# Patient Record
Sex: Male | Born: 1940 | Race: White | Hispanic: No | Marital: Married | Smoking: Never smoker
Health system: Southern US, Community
[De-identification: ages and names within clinical notes are randomized; demographics above are authoritative.]

## PROBLEM LIST (undated history)

## (undated) DIAGNOSIS — Z951 Presence of aortocoronary bypass graft: Secondary | ICD-10-CM

## (undated) DIAGNOSIS — I10 Essential (primary) hypertension: Secondary | ICD-10-CM

## (undated) DIAGNOSIS — R112 Nausea with vomiting, unspecified: Secondary | ICD-10-CM

## (undated) DIAGNOSIS — Z9189 Other specified personal risk factors, not elsewhere classified: Secondary | ICD-10-CM

## (undated) DIAGNOSIS — I350 Nonrheumatic aortic (valve) stenosis: Secondary | ICD-10-CM

## (undated) DIAGNOSIS — R29898 Other symptoms and signs involving the musculoskeletal system: Secondary | ICD-10-CM

## (undated) DIAGNOSIS — M1711 Unilateral primary osteoarthritis, right knee: Secondary | ICD-10-CM

## (undated) DIAGNOSIS — E039 Hypothyroidism, unspecified: Secondary | ICD-10-CM

## (undated) DIAGNOSIS — I77810 Thoracic aortic ectasia: Secondary | ICD-10-CM

## (undated) DIAGNOSIS — Z9889 Other specified postprocedural states: Secondary | ICD-10-CM

## (undated) DIAGNOSIS — I2510 Atherosclerotic heart disease of native coronary artery without angina pectoris: Secondary | ICD-10-CM

## (undated) DIAGNOSIS — S83249A Other tear of medial meniscus, current injury, unspecified knee, initial encounter: Secondary | ICD-10-CM

## (undated) DIAGNOSIS — I517 Cardiomegaly: Secondary | ICD-10-CM

## (undated) DIAGNOSIS — I340 Nonrheumatic mitral (valve) insufficiency: Secondary | ICD-10-CM

## (undated) DIAGNOSIS — G4733 Obstructive sleep apnea (adult) (pediatric): Secondary | ICD-10-CM

## (undated) DIAGNOSIS — Z9989 Dependence on other enabling machines and devices: Secondary | ICD-10-CM

## (undated) DIAGNOSIS — Z8679 Personal history of other diseases of the circulatory system: Secondary | ICD-10-CM

## (undated) DIAGNOSIS — Z973 Presence of spectacles and contact lenses: Secondary | ICD-10-CM

## (undated) DIAGNOSIS — E785 Hyperlipidemia, unspecified: Secondary | ICD-10-CM

## (undated) DIAGNOSIS — D509 Iron deficiency anemia, unspecified: Secondary | ICD-10-CM

## (undated) DIAGNOSIS — M1990 Unspecified osteoarthritis, unspecified site: Secondary | ICD-10-CM

## (undated) DIAGNOSIS — I440 Atrioventricular block, first degree: Secondary | ICD-10-CM

## (undated) DIAGNOSIS — N400 Enlarged prostate without lower urinary tract symptoms: Secondary | ICD-10-CM

## (undated) HISTORY — PX: CORONARY ARTERY BYPASS GRAFT: SHX141

---
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
## 2003-02-20 ENCOUNTER — Encounter: Payer: Self-pay | Admitting: Specialist

## 2003-02-20 ENCOUNTER — Ambulatory Visit (HOSPITAL_COMMUNITY): Admission: RE | Admit: 2003-02-20 | Discharge: 2003-02-20 | Payer: Self-pay | Admitting: Specialist

## 2003-03-06 ENCOUNTER — Encounter: Payer: Self-pay | Admitting: Specialist

## 2003-03-07 ENCOUNTER — Encounter: Payer: Self-pay | Admitting: Specialist

## 2003-03-07 ENCOUNTER — Observation Stay (HOSPITAL_COMMUNITY): Admission: RE | Admit: 2003-03-07 | Discharge: 2003-03-09 | Payer: Self-pay | Admitting: Specialist

## 2003-08-09 HISTORY — PX: SHOULDER OPEN ROTATOR CUFF REPAIR: SHX2407

## 2008-08-08 HISTORY — PX: LUMBAR SPINE SURGERY: SHX701

## 2013-08-08 DIAGNOSIS — M1711 Unilateral primary osteoarthritis, right knee: Secondary | ICD-10-CM

## 2013-08-08 DIAGNOSIS — S83249A Other tear of medial meniscus, current injury, unspecified knee, initial encounter: Secondary | ICD-10-CM

## 2013-08-08 HISTORY — DX: Unilateral primary osteoarthritis, right knee: M17.11

## 2013-08-08 HISTORY — DX: Other tear of medial meniscus, current injury, unspecified knee, initial encounter: S83.249A

## 2013-11-01 ENCOUNTER — Other Ambulatory Visit: Payer: Self-pay | Admitting: Orthopedic Surgery

## 2013-11-05 ENCOUNTER — Encounter (HOSPITAL_BASED_OUTPATIENT_CLINIC_OR_DEPARTMENT_OTHER): Payer: Self-pay | Admitting: *Deleted

## 2013-11-07 ENCOUNTER — Other Ambulatory Visit: Payer: Self-pay | Admitting: Orthopedic Surgery

## 2013-11-07 ENCOUNTER — Encounter (HOSPITAL_BASED_OUTPATIENT_CLINIC_OR_DEPARTMENT_OTHER): Payer: Self-pay | Admitting: *Deleted

## 2013-11-07 NOTE — Progress Notes (Signed)
SPOKE W/ PT WIFE.  NPO AFTER MN WITH EXCEPTION CLEAR LIQUIDS(NO CREAM/ MILK PRODUCTS) UNTIL 0800 INCLUDING NO CHEW TOBACCO. ARRIVE AT 1215.  NEEDS ISTAT.  CURRENT EKG, LOV NOTE, AND STRESS TEST TO BE FAXED FROM CARDIOLOGIST IN [place] AND PT'S PCP .  WILL TAKE ATENOLOL, LIPITOR, AND SYNTHROID AM DOS W/ SIPS OF WATER.

## 2013-11-07 NOTE — Progress Notes (Addendum)
11/07/13 1222  OBSTRUCTIVE SLEEP APNEA  Have you ever been diagnosed with sleep apnea through a sleep study? No  Do you snore loudly (loud enough to be heard through closed doors)?  1  Do you often feel tired, fatigued, or sleepy during the daytime? 0  Has anyone observed you stop breathing during your sleep? 1  Do you have, or are you being treated for high blood pressure? 1  BMI more than 35 kg/m2? 0  Age over 71 years old? 1  Neck circumference greater than 40 cm/18 inches? 1  Gender: 1  Obstructive Sleep Apnea Score 6  Score 4 or greater  Results sent to PCP   TO BE FAXED TO PT'S PCP ,  DR Lorie ApleyBRIAN MITCHELL [place]  [phone]

## 2013-11-07 NOTE — H&P (Signed)
Dominic Rogers is an 73 y.o. male.   Chief Complaint: R knee pain HPI: Intermittent R knee pain x years refractory to multiple aspirations and injections, recurrent effusions and pain. No instability or mechanical symptoms.  Past Medical History  Diagnosis Date  . Right knee DJD   . Hypertension   . Hyperlipidemia   . S/P CABG x 4     2012  . Iron deficiency anemia   . Hypothyroidism   . BPH (benign prostatic hypertrophy)   . Arthritis     knees, shoulders, hands  . Wears glasses   . At risk for sleep apnea     STOP-BANG=  6   SENT TO PCP DR Lorie Apley  IN [place], [place]  . PONV (postoperative nausea and vomiting)   . Coronary artery disease     CARDIOLOGIST--  DR Serena Croissant  WITH [facility]    Past Surgical History  Procedure Laterality Date  . Shoulder open rotator cuff repair Right 2005  . Lumbar spine surgery  2010  . Coronary artery bypass graft  2012  mission hospital in [place]    4 vessel  . Orif left hand and reattachement two fingers  1960's    No family history on file. Social History:  reports that he has never smoked. His smokeless tobacco use includes Chew. He reports that he drinks alcohol. He reports that he does not use illicit drugs.  Allergies:  Allergies  Allergen Reactions  . Morphine And Related Itching    SEVERE     (Not in a hospital admission)  No results found for this or any previous visit (from the past 48 hour(s)). No results found.  Review of Systems  Constitutional: Negative.   HENT: Negative.   Eyes: Negative.   Respiratory: Negative.   Cardiovascular: Negative.   Gastrointestinal: Negative.   Genitourinary: Negative.   Musculoskeletal: Positive for joint pain.  Skin: Negative.   Neurological: Negative.   Psychiatric/Behavioral: Negative.     There were no vitals taken for this visit. Physical Exam  Constitutional: He is oriented to person, place, and time. He appears well-developed and  well-nourished.  HENT:  Head: Normocephalic and atraumatic.  Eyes: Conjunctivae and EOM are normal. Pupils are equal, round, and reactive to light.  Neck: Normal range of motion. Neck supple.  Cardiovascular: Normal rate and regular rhythm.   Respiratory: Effort normal and breath sounds normal.  GI: Soft. Bowel sounds are normal.  Musculoskeletal:  Right Knee: Inspection and Palpation:Tenderness- medial joint line tender to palpation. no tenderness to palpation of the superior calf, no tenderness to palpation of the pes anserine bursa, no tenderness to palpation of the quadriceps tendon, no tenderness to palpation of the patellar tendon, no tenderness to palpation of the patella, no tenderness to palpation of the lateral joint line, no tenderness to palpation of the fibular head and no tenderness to palpation of the peroneal nerve. Swelling- periarticular swelling present. Effusion- mild. Tissue tension/texture is - soft. Crepitus- mild. Pulses- 2+. Sensation- intact to light touch. Skin- Color- no ecchymosis and no erythema. Strength and Tone:Quadriceps- 5/5. Hamstrings- 5/5. ROM: Flexion:AROM- 110 . Extension:AROM- 0 . Stability- Valgus Laxity at 30- None. Valgus Laxity at 0- None. Varus Laxity at 30- None. Varus Laxity at 0- None. Lachman- Negative. Anterior Drawer Test - Negative. Posterior Drawer Test- Negative. Deformities/Malalignments/Discrepancies- no deformities noted. Special Tests:McMurray Test (lateral) - negative. McMurray Test (medial)- negative. Patellar Compression Pain- mild pain.  Neurological: He is alert and  oriented to person, place, and time. He has normal reflexes.  Skin: Skin is warm and dry.  Psychiatric: He has a normal mood and affect.     Assessment/Plan R knee DJD  I had a long discussion with the patient concerning the risks and benefits of knee arthroscopy including help from the arthroscopic procedure as well as no help  from the arthroscopic procedure or worsening of symptoms. Also discussed infection, DVT, PE, anesthetic complications, etc. Also discussed the possibility of repeat arthroscopic surgery required in the future or total knee replacement. I provided the patient with an illustrated handout and discussed it in detail as well as discussed the postoperative and perioperative courses and return to functional activities including work. Need for postoperative DVT prophylaxis was discussed as well.   Plan R knee arthroscopy, debridement  BISSELL, JACLYN M. for Dr. Shelle IronBeane 11/07/2013, 1:22 PM

## 2013-11-15 ENCOUNTER — Ambulatory Visit (HOSPITAL_BASED_OUTPATIENT_CLINIC_OR_DEPARTMENT_OTHER)
Admission: RE | Admit: 2013-11-15 | Discharge: 2013-11-15 | Disposition: A | Discharge status: Home / Self Care | Payer: Medicare Other | Source: Ambulatory Visit | Attending: Specialist | Admitting: Specialist

## 2013-11-15 ENCOUNTER — Encounter (HOSPITAL_BASED_OUTPATIENT_CLINIC_OR_DEPARTMENT_OTHER): Payer: Medicare Other | Admitting: Anesthesiology

## 2013-11-15 ENCOUNTER — Ambulatory Visit (HOSPITAL_BASED_OUTPATIENT_CLINIC_OR_DEPARTMENT_OTHER): Payer: Medicare Other | Admitting: Anesthesiology

## 2013-11-15 ENCOUNTER — Encounter (HOSPITAL_BASED_OUTPATIENT_CLINIC_OR_DEPARTMENT_OTHER): Payer: Self-pay

## 2013-11-15 ENCOUNTER — Encounter (HOSPITAL_BASED_OUTPATIENT_CLINIC_OR_DEPARTMENT_OTHER)
Admission: RE | Disposition: A | Discharge status: Home / Self Care | Payer: Self-pay | Source: Ambulatory Visit | Attending: Specialist

## 2013-11-15 DIAGNOSIS — N400 Enlarged prostate without lower urinary tract symptoms: Secondary | ICD-10-CM | POA: Insufficient documentation

## 2013-11-15 DIAGNOSIS — F17200 Nicotine dependence, unspecified, uncomplicated: Secondary | ICD-10-CM | POA: Insufficient documentation

## 2013-11-15 DIAGNOSIS — M2240 Chondromalacia patellae, unspecified knee: Secondary | ICD-10-CM | POA: Insufficient documentation

## 2013-11-15 DIAGNOSIS — I2510 Atherosclerotic heart disease of native coronary artery without angina pectoris: Secondary | ICD-10-CM | POA: Insufficient documentation

## 2013-11-15 DIAGNOSIS — I10 Essential (primary) hypertension: Secondary | ICD-10-CM | POA: Insufficient documentation

## 2013-11-15 DIAGNOSIS — M1711 Unilateral primary osteoarthritis, right knee: Secondary | ICD-10-CM

## 2013-11-15 DIAGNOSIS — E039 Hypothyroidism, unspecified: Secondary | ICD-10-CM | POA: Insufficient documentation

## 2013-11-15 DIAGNOSIS — M129 Arthropathy, unspecified: Secondary | ICD-10-CM | POA: Insufficient documentation

## 2013-11-15 DIAGNOSIS — IMO0002 Reserved for concepts with insufficient information to code with codable children: Secondary | ICD-10-CM | POA: Insufficient documentation

## 2013-11-15 DIAGNOSIS — Z951 Presence of aortocoronary bypass graft: Secondary | ICD-10-CM | POA: Insufficient documentation

## 2013-11-15 DIAGNOSIS — E785 Hyperlipidemia, unspecified: Secondary | ICD-10-CM | POA: Insufficient documentation

## 2013-11-15 DIAGNOSIS — S83289A Other tear of lateral meniscus, current injury, unspecified knee, initial encounter: Secondary | ICD-10-CM | POA: Insufficient documentation

## 2013-11-15 DIAGNOSIS — D509 Iron deficiency anemia, unspecified: Secondary | ICD-10-CM | POA: Insufficient documentation

## 2013-11-15 DIAGNOSIS — M1710 Unilateral primary osteoarthritis, unspecified knee: Secondary | ICD-10-CM | POA: Insufficient documentation

## 2013-11-15 DIAGNOSIS — Z885 Allergy status to narcotic agent status: Secondary | ICD-10-CM | POA: Insufficient documentation

## 2013-11-15 DIAGNOSIS — X58XXXA Exposure to other specified factors, initial encounter: Secondary | ICD-10-CM | POA: Insufficient documentation

## 2013-11-15 HISTORY — DX: Essential (primary) hypertension: I10

## 2013-11-15 HISTORY — DX: Presence of spectacles and contact lenses: Z97.3

## 2013-11-15 HISTORY — DX: Atherosclerotic heart disease of native coronary artery without angina pectoris: I25.10

## 2013-11-15 HISTORY — DX: Unilateral primary osteoarthritis, right knee: M17.11

## 2013-11-15 HISTORY — DX: Hyperlipidemia, unspecified: E78.5

## 2013-11-15 HISTORY — DX: Iron deficiency anemia, unspecified: D50.9

## 2013-11-15 HISTORY — DX: Presence of aortocoronary bypass graft: Z95.1

## 2013-11-15 HISTORY — DX: Benign prostatic hyperplasia without lower urinary tract symptoms: N40.0

## 2013-11-15 HISTORY — DX: Hypothyroidism, unspecified: E03.9

## 2013-11-15 HISTORY — DX: Other specified postprocedural states: Z98.890

## 2013-11-15 HISTORY — PX: KNEE ARTHROSCOPY: SHX127

## 2013-11-15 HISTORY — DX: Other specified postprocedural states: R11.2

## 2013-11-15 HISTORY — DX: Unspecified osteoarthritis, unspecified site: M19.90

## 2013-11-15 HISTORY — DX: Other specified personal risk factors, not elsewhere classified: Z91.89

## 2013-11-15 LAB — POCT I-STAT 4, (NA,K, GLUC, HGB,HCT)
Glucose, Bld: 101 mg/dL — ABNORMAL HIGH (ref 70–99)
HCT: 41 % (ref 39.0–52.0)
HEMOGLOBIN: 13.9 g/dL (ref 13.0–17.0)
Potassium: 3.8 mEq/L (ref 3.7–5.3)
Sodium: 140 mEq/L (ref 137–147)

## 2013-11-15 SURGERY — ARTHROSCOPY, KNEE
Anesthesia: General | Site: Knee | Laterality: Right

## 2013-11-15 MED ORDER — CEFAZOLIN SODIUM-DEXTROSE 2-3 GM-% IV SOLR
2.0000 g | INTRAVENOUS | Status: AC
  Administered 2013-11-15: 2 g via INTRAVENOUS
  Filled 2013-11-15: qty 50

## 2013-11-15 MED ORDER — SODIUM CHLORIDE 0.9 % IR SOLN
Status: DC | PRN
  Administered 2013-11-15: 6000 mL

## 2013-11-15 MED ORDER — ONDANSETRON 8 MG PO TBDP: 8.0000 mg | ORAL_TABLET | Freq: Three times a day (TID) | ORAL | Status: DC | PRN

## 2013-11-15 MED ORDER — PROPOFOL 10 MG/ML IV BOLUS
INTRAVENOUS | Status: DC | PRN
  Administered 2013-11-15: 150 mg via INTRAVENOUS

## 2013-11-15 MED ORDER — MEPERIDINE HCL 25 MG/ML IJ SOLN
6.2500 mg | INTRAMUSCULAR | Status: DC | PRN
  Filled 2013-11-15: qty 1

## 2013-11-15 MED ORDER — BUPIVACAINE-EPINEPHRINE 0.5% -1:200000 IJ SOLN
INTRAMUSCULAR | Status: DC | PRN
  Administered 2013-11-15: 30 mL

## 2013-11-15 MED ORDER — EPINEPHRINE HCL 1 MG/ML IJ SOLN
INTRAMUSCULAR | Status: DC | PRN
  Administered 2013-11-15: 2 mg

## 2013-11-15 MED ORDER — HYDROCODONE-ACETAMINOPHEN 5-325 MG PO TABS: 1.0000 | ORAL_TABLET | ORAL | Status: DC | PRN

## 2013-11-15 MED ORDER — DEXAMETHASONE SODIUM PHOSPHATE 4 MG/ML IJ SOLN
INTRAMUSCULAR | Status: DC | PRN
  Administered 2013-11-15: 10 mg via INTRAVENOUS

## 2013-11-15 MED ORDER — FENTANYL CITRATE 0.05 MG/ML IJ SOLN
25.0000 ug | INTRAMUSCULAR | Status: DC | PRN
Start: 2013-11-15 — End: 2013-11-15
  Filled 2013-11-15: qty 1

## 2013-11-15 MED ORDER — ONDANSETRON HCL 4 MG/2ML IJ SOLN
INTRAMUSCULAR | Status: DC | PRN
  Administered 2013-11-15: 4 mg via INTRAVENOUS

## 2013-11-15 MED ORDER — TAMSULOSIN HCL 0.4 MG PO CAPS
0.4000 mg | ORAL_CAPSULE | Freq: Every day | ORAL | Status: DC
  Administered 2013-11-15: 0.4 mg via ORAL
  Filled 2013-11-15: qty 1

## 2013-11-15 MED ORDER — FENTANYL CITRATE 0.05 MG/ML IJ SOLN
INTRAMUSCULAR | Status: AC
  Filled 2013-11-15: qty 6

## 2013-11-15 MED ORDER — LACTATED RINGERS IV SOLN
INTRAVENOUS | Status: DC
  Filled 2013-11-15: qty 1000

## 2013-11-15 MED ORDER — TAMSULOSIN HCL 0.4 MG PO CAPS
ORAL_CAPSULE | ORAL | Status: AC
  Filled 2013-11-15: qty 1

## 2013-11-15 MED ORDER — LACTATED RINGERS IV SOLN
INTRAVENOUS | Status: DC
  Administered 2013-11-15: 13:00:00 via INTRAVENOUS
  Filled 2013-11-15: qty 1000

## 2013-11-15 MED ORDER — LIDOCAINE HCL (CARDIAC) 20 MG/ML IV SOLN
INTRAVENOUS | Status: DC | PRN
  Administered 2013-11-15: 60 mg via INTRAVENOUS

## 2013-11-15 MED ORDER — KETOROLAC TROMETHAMINE 30 MG/ML IJ SOLN
INTRAMUSCULAR | Status: DC | PRN
  Administered 2013-11-15: 15 mg via INTRAVENOUS

## 2013-11-15 MED ORDER — GLYCOPYRROLATE 0.2 MG/ML IJ SOLN
INTRAMUSCULAR | Status: DC | PRN
  Administered 2013-11-15: 0.4 mg via INTRAVENOUS

## 2013-11-15 MED ORDER — PROMETHAZINE HCL 25 MG/ML IJ SOLN
6.2500 mg | INTRAMUSCULAR | Status: DC | PRN
  Filled 2013-11-15: qty 1

## 2013-11-15 MED ORDER — FENTANYL CITRATE 0.05 MG/ML IJ SOLN
INTRAMUSCULAR | Status: DC | PRN
  Administered 2013-11-15 (×2): 25 ug via INTRAVENOUS
  Administered 2013-11-15: 50 ug via INTRAVENOUS
  Administered 2013-11-15: 25 ug via INTRAVENOUS

## 2013-11-15 MED ORDER — TRAMADOL HCL 50 MG PO TABS: 50.0000 mg | ORAL_TABLET | Freq: Four times a day (QID) | ORAL | Status: DC | PRN

## 2013-11-15 SURGICAL SUPPLY — 45 items
BANDAGE ELASTIC 6 VELCRO ST LF (GAUZE/BANDAGES/DRESSINGS) ×2 IMPLANT
BLADE 4.2CUDA (BLADE) IMPLANT
BLADE CUDA SHAVER 3.5 (BLADE) ×2 IMPLANT
BLADE GREAT WHITE 4.2 (BLADE) IMPLANT
BNDG COHESIVE 6X5 TAN NS LF (GAUZE/BANDAGES/DRESSINGS) ×2 IMPLANT
BOOTIES KNEE HIGH SLOAN (MISCELLANEOUS) ×2 IMPLANT
CANISTER SUCT LVC 12 LTR MEDI- (MISCELLANEOUS) ×2 IMPLANT
CANISTER SUCTION 1200CC (MISCELLANEOUS) IMPLANT
CANISTER SUCTION 2500CC (MISCELLANEOUS) IMPLANT
CANNULA ACUFLEX KIT 5X76 (CANNULA) IMPLANT
CLOTH BEACON ORANGE TIMEOUT ST (SAFETY) ×2 IMPLANT
CUTTER MENISCUS  4.2MM (BLADE)
CUTTER MENISCUS 4.2MM (BLADE) IMPLANT
DRAPE ARTHROSCOPY W/POUCH 114 (DRAPES) ×2 IMPLANT
DURAPREP 26ML APPLICATOR (WOUND CARE) ×2 IMPLANT
ELECT REM PT RETURN 9FT ADLT (ELECTROSURGICAL)
ELECTRODE REM PT RTRN 9FT ADLT (ELECTROSURGICAL) IMPLANT
GAUZE SPONGE 4X4 16PLY XRAY LF (GAUZE/BANDAGES/DRESSINGS) ×2 IMPLANT
GLOVE SURG SS PI 8.0 STRL IVOR (GLOVE) ×2 IMPLANT
GOWN PREVENTION PLUS LG XLONG (DISPOSABLE) ×2 IMPLANT
GOWN STRL REIN XL XLG (GOWN DISPOSABLE) ×2 IMPLANT
GOWN STRL REUS W/ TWL XL LVL3 (GOWN DISPOSABLE) ×2 IMPLANT
GOWN STRL REUS W/TWL XL LVL3 (GOWN DISPOSABLE) ×2
IV NS IRRIG 3000ML ARTHROMATIC (IV SOLUTION) ×4 IMPLANT
KNEE WRAP E Z 3 GEL PACK (MISCELLANEOUS) ×2 IMPLANT
MINI VAC (SURGICAL WAND) IMPLANT
NDL SAFETY ECLIPSE 18X1.5 (NEEDLE) ×1 IMPLANT
NEEDLE FILTER BLUNT 18X 1/2SAF (NEEDLE) ×1
NEEDLE FILTER BLUNT 18X1 1/2 (NEEDLE) ×1 IMPLANT
NEEDLE HYPO 18GX1.5 SHARP (NEEDLE) ×1
PACK ARTHROSCOPY DSU (CUSTOM PROCEDURE TRAY) ×2 IMPLANT
PACK BASIN DAY SURGERY FS (CUSTOM PROCEDURE TRAY) ×2 IMPLANT
PAD CAST 4YDX4 CTTN HI CHSV (CAST SUPPLIES) ×1 IMPLANT
PADDING CAST COTTON 4X4 STRL (CAST SUPPLIES) ×1
PADDING CAST COTTON 6X4 STRL (CAST SUPPLIES) ×2 IMPLANT
RESECTOR FULL RADIUS 4.2MM (BLADE) IMPLANT
SET ARTHROSCOPY TUBING (MISCELLANEOUS) ×1
SET ARTHROSCOPY TUBING LN (MISCELLANEOUS) ×1 IMPLANT
SPONGE GAUZE 4X4 12PLY (GAUZE/BANDAGES/DRESSINGS) ×2 IMPLANT
SUT ETHILON 4 0 PS 2 18 (SUTURE) ×2 IMPLANT
SYR 30ML LL (SYRINGE) ×2 IMPLANT
SYRINGE 10CC LL (SYRINGE) ×2 IMPLANT
TOWEL OR 17X24 6PK STRL BLUE (TOWEL DISPOSABLE) ×2 IMPLANT
WAND 90 DEG TURBOVAC W/CORD (SURGICAL WAND) IMPLANT
WATER STERILE IRR 500ML POUR (IV SOLUTION) ×2 IMPLANT

## 2013-11-15 NOTE — Anesthesia Preprocedure Evaluation (Signed)
Anesthesia Evaluation  Patient identified by MRN, date of birth, ID band Patient awake    Reviewed: Allergy & Precautions, H&P , NPO status , Patient's Chart, lab work & pertinent test results  History of Anesthesia Complications (+) PONV  Airway Mallampati: II TM Distance: >3 FB Neck ROM: Full    Dental no notable dental hx.    Pulmonary neg pulmonary ROS,  breath sounds clear to auscultation  Pulmonary exam normal       Cardiovascular Exercise Tolerance: Good hypertension, Pt. on medications + CAD and + CABG (2012) Rhythm:Regular Rate:Normal     Neuro/Psych negative neurological ROS  negative psych ROS   GI/Hepatic negative GI ROS, Neg liver ROS, hiatal hernia,   Endo/Other  negative endocrine ROS  Renal/GU negative Renal ROS  negative genitourinary   Musculoskeletal negative musculoskeletal ROS (+)   Abdominal   Peds negative pediatric ROS (+)  Hematology negative hematology ROS (+)   Anesthesia Other Findings   Reproductive/Obstetrics negative OB ROS                           Anesthesia Physical Anesthesia Plan  ASA: III  Anesthesia Plan: General   Post-op Pain Management:    Induction: Intravenous  Airway Management Planned: LMA  Additional Equipment:   Intra-op Plan:   Post-operative Plan: Extubation in OR  Informed Consent: I have reviewed the patients History and Physical, chart, labs and discussed the procedure including the risks, benefits and alternatives for the proposed anesthesia with the patient or authorized representative who has indicated his/her understanding and acceptance.   Dental advisory given  Plan Discussed with: CRNA  Anesthesia Plan Comments:         Anesthesia Quick Evaluation

## 2013-11-15 NOTE — Anesthesia Postprocedure Evaluation (Signed)
  Anesthesia Post-op Note  Patient: Dominic Rogers  Procedure(s) Performed: Procedure(s) (LRB): RIGHT KNEE ARTHROSCOPY WITH DEBRIDEMENT AND PARTIAL MEDIAL AND LATERAL MENISECTOMY   (Right)  Patient Location: PACU  Anesthesia Type: General  Level of Consciousness: awake and alert   Airway and Oxygen Therapy: Patient Spontanous Breathing  Post-op Pain: mild  Post-op Assessment: Post-op Vital signs reviewed, Patient's Cardiovascular Status Stable, Respiratory Function Stable, Patent Airway and No signs of Nausea or vomiting  Last Vitals:  Filed Vitals:   11/15/13 1645  BP: 135/73  Pulse: 53  Temp:   Resp: 14    Post-op Vital Signs: stable   Complications: No apparent anesthesia complications

## 2013-11-15 NOTE — Brief Op Note (Signed)
11/15/2013  4:09 PM  PATIENT:  Bertis RuddyWilliam E Emami  73 y.o. male  PRE-OPERATIVE DIAGNOSIS:  RIGHT KNEE DJD   POST-OPERATIVE DIAGNOSIS:  RIGHT KNEE DJD   PROCEDURE:  Procedure(s): RIGHT KNEE ARTHROSCOPY WITH DEBRIDEMENT AND PARTIAL MEDIAL AND LATERAL MENISECTOMY   (Right)  SURGEON:  Surgeon(s) and Role:    * Javier DockerJeffrey C Tyrihanna Wingert, MD - Primary  PHYSICIAN ASSISTANT:   ASSISTANTS: none   ANESTHESIA:   general  EBL:  Total I/O In: 200 [I.V.:200] Out: -   BLOOD ADMINISTERED:none  DRAINS: none   LOCAL MEDICATIONS USED:  MARCAINE     SPECIMEN:  No Specimen  DISPOSITION OF SPECIMEN:  N/A  COUNTS:  YES  TOURNIQUET:  * No tourniquets in log *  DICTATION: .Other Dictation: Dictation Number [id number]  PLAN OF CARE: Discharge to home after PACU  PATIENT DISPOSITION:  PACU - hemodynamically stable.   Delay start of Pharmacological VTE agent (>24hrs) due to surgical blood loss or risk of bleeding: no

## 2013-11-15 NOTE — Transfer of Care (Signed)
Immediate Anesthesia Transfer of Care Note  Patient: Dominic Rogers  Procedure(s) Performed: Procedure(s) (LRB): RIGHT KNEE ARTHROSCOPY WITH DEBRIDEMENT AND PARTIAL MEDIAL AND LATERAL MENISECTOMY   (Right)  Patient Location: PACU  Anesthesia Type: General  Level of Consciousness: awake, oriented, sedated and patient cooperative  Airway & Oxygen Therapy: Patient Spontanous Breathing and Patient connected to face mask oxygen  Post-op Assessment: Report given to PACU RN and Post -op Vital signs reviewed and stable  Post vital signs: Reviewed and stable  Complications: No apparent anesthesia complications

## 2013-11-15 NOTE — Anesthesia Procedure Notes (Signed)
Procedure Name: LMA Insertion Date/Time: 11/15/2013 3:26 PM Performed by: Renella CunasHAZEL, Burhanuddin Kohlmann D Pre-anesthesia Checklist: Patient identified, Emergency Drugs available, Suction available and Patient being monitored Patient Re-evaluated:Patient Re-evaluated prior to inductionOxygen Delivery Method: Circle System Utilized Preoxygenation: Pre-oxygenation with 100% oxygen Intubation Type: IV induction Ventilation: Mask ventilation without difficulty LMA: LMA inserted LMA Size: 5.0 Number of attempts: 2 Airway Equipment and Method: bite block Placement Confirmation: positive ETCO2 Tube secured with: Tape Dental Injury: Teeth and Oropharynx as per pre-operative assessment

## 2013-11-15 NOTE — H&P (View-Only) (Signed)
Dominic Rogers is an 73 y.o. male.   Chief Complaint: R knee pain HPI: Intermittent R knee pain x years refractory to multiple aspirations and injections, recurrent effusions and pain. No instability or mechanical symptoms.  Past Medical History  Diagnosis Date  . Right knee DJD   . Hypertension   . Hyperlipidemia   . S/P CABG x 4     2012  . Iron deficiency anemia   . Hypothyroidism   . BPH (benign prostatic hypertrophy)   . Arthritis     knees, shoulders, hands  . Wears glasses   . At risk for sleep apnea     STOP-BANG=  6   SENT TO PCP DR Lorie Apley  IN [place], [place]  . PONV (postoperative nausea and vomiting)   . Coronary artery disease     CARDIOLOGIST--  DR Serena Croissant  WITH [facility]    Past Surgical History  Procedure Laterality Date  . Shoulder open rotator cuff repair Right 2005  . Lumbar spine surgery  2010  . Coronary artery bypass graft  2012  mission hospital in [place]    4 vessel  . Orif left hand and reattachement two fingers  1960's    No family history on file. Social History:  reports that he has never smoked. His smokeless tobacco use includes Chew. He reports that he drinks alcohol. He reports that he does not use illicit drugs.  Allergies:  Allergies  Allergen Reactions  . Morphine And Related Itching    SEVERE     (Not in a hospital admission)  No results found for this or any previous visit (from the past 48 hour(s)). No results found.  Review of Systems  Constitutional: Negative.   HENT: Negative.   Eyes: Negative.   Respiratory: Negative.   Cardiovascular: Negative.   Gastrointestinal: Negative.   Genitourinary: Negative.   Musculoskeletal: Positive for joint pain.  Skin: Negative.   Neurological: Negative.   Psychiatric/Behavioral: Negative.     There were no vitals taken for this visit. Physical Exam  Constitutional: He is oriented to person, place, and time. He appears well-developed and  well-nourished.  HENT:  Head: Normocephalic and atraumatic.  Eyes: Conjunctivae and EOM are normal. Pupils are equal, round, and reactive to light.  Neck: Normal range of motion. Neck supple.  Cardiovascular: Normal rate and regular rhythm.   Respiratory: Effort normal and breath sounds normal.  GI: Soft. Bowel sounds are normal.  Musculoskeletal:  Right Knee: Inspection and Palpation:Tenderness- medial joint line tender to palpation. no tenderness to palpation of the superior calf, no tenderness to palpation of the pes anserine bursa, no tenderness to palpation of the quadriceps tendon, no tenderness to palpation of the patellar tendon, no tenderness to palpation of the patella, no tenderness to palpation of the lateral joint line, no tenderness to palpation of the fibular head and no tenderness to palpation of the peroneal nerve. Swelling- periarticular swelling present. Effusion- mild. Tissue tension/texture is - soft. Crepitus- mild. Pulses- 2+. Sensation- intact to light touch. Skin- Color- no ecchymosis and no erythema. Strength and Tone:Quadriceps- 5/5. Hamstrings- 5/5. ROM: Flexion:AROM- 110 . Extension:AROM- 0 . Stability- Valgus Laxity at 30- None. Valgus Laxity at 0- None. Varus Laxity at 30- None. Varus Laxity at 0- None. Lachman- Negative. Anterior Drawer Test - Negative. Posterior Drawer Test- Negative. Deformities/Malalignments/Discrepancies- no deformities noted. Special Tests:McMurray Test (lateral) - negative. McMurray Test (medial)- negative. Patellar Compression Pain- mild pain.  Neurological: He is alert and  oriented to person, place, and time. He has normal reflexes.  Skin: Skin is warm and dry.  Psychiatric: He has a normal mood and affect.     Assessment/Plan R knee DJD  I had a long discussion with the patient concerning the risks and benefits of knee arthroscopy including help from the arthroscopic procedure as well as no help  from the arthroscopic procedure or worsening of symptoms. Also discussed infection, DVT, PE, anesthetic complications, etc. Also discussed the possibility of repeat arthroscopic surgery required in the future or total knee replacement. I provided the patient with an illustrated handout and discussed it in detail as well as discussed the postoperative and perioperative courses and return to functional activities including work. Need for postoperative DVT prophylaxis was discussed as well.   Plan R knee arthroscopy, debridement  BISSELL, JACLYN M. for Dr. Shelle IronBeane 11/07/2013, 1:22 PM

## 2013-11-15 NOTE — Interval H&P Note (Signed)
History and Physical Interval Note:  11/15/2013 2:02 PM  Dominic Rogers  has presented today for surgery, with the diagnosis of RIGHT KNEE DJD   The various methods of treatment have been discussed with the patient and family. After consideration of risks, benefits and other options for treatment, the patient has consented to  Procedure(s): RIGHT KNEE ARTHROSCOPY WITH DEBRIDEMENT  (Right) as a surgical intervention .  The patient's history has been reviewed, patient examined, no change in status, stable for surgery.  I have reviewed the patient's chart and labs.  Questions were answered to the patient's satisfaction.     Javier DockerJeffrey C Wyman Meschke

## 2013-11-15 NOTE — Progress Notes (Signed)
Dr. Shelle IronBeane on call pa  Was called because patient did not void and his wife was concerned.   She stated that this was not unusally because he does this at home.  She also stated that he would not allow anyone to check him.  flomax was ordered and given to patient with an ok to discharge him from [facility]

## 2013-11-15 NOTE — Discharge Instructions (Signed)
ARTHROSCOPIC KNEE SURGERY HOME CARE INSTRUCTIONS ° ° °PAIN °You will be expected to have a moderate amount of pain in the affected knee for approximately two weeks.  However, the first two to four days will be the most severe in terms of the pain you will experience.  Prescriptions have been provided for you to take as needed for the pain.  The pain can be markedly reduced by using the ice/compressive bandage given.  Exchange the ice packs whenever they thaw.  During the night, keep the bandage on because it will still provide some compression for the swelling.  Also, keep the leg elevated on pillows above your heart, and this will help alleviate the pain and swelling. ° °MEDICATION °Prescriptions have been provided to take as needed for pain. To prevent blood clots, take Aspirin 325mg daily with a meal if not on a blood thinner and if no history of stomach ulcers. ° °ACTIVITY °It is preferred that you stay on bedrest for approximately 24 hours.  However, you may go to the bathroom with help.  After this, you can start to be up and about progressively more.  Remember that the swelling may still increase after three to four days if you are up and doing too much.  You may put as much weight on the affected leg as pain will allow.  Use your crutches for comfort and safety.  However, as soon as you are able, you may discard the crutches and go without them.  ° °DRESSING °Keep the current dressing as dry as possible.  Two days after your surgery, you may remove the ice/compressive wrap, and surgical dressing.  You may now take a shower, but do not scrub the sounds directly with soap.  Let water rinse over these and gently wipe with your hand.  Reapply band-aids over the puncture wounds and more gauze if needed.  A slight amount of thin drainage can be normal at this time, and do not let it frighten you.  Reapply the ice/compressive wrap.  You may now repeat this every day each time you shower. ° °SYMPTOMS TO REPORT TO  YOUR DOCTOR ° -Extreme pain. ° -Extreme swelling. ° -Temperature above 101 degrees that does not come down with acetaminophen     (Tylenol). ° -Any changes in the feeling, color or movement of your toes. ° -Extreme redness, heat, swelling or drainage at your incision ° °EXERCISE °It is preferred that you begin to exercise on the day of your surgery.  Straight leg raises and short arc quads should be begun the afternoon or evening of surgery and continued until you come back for your follow-up appointment.   Attached is an instruction sheet on how to perform these two simple exercises.  Do these at least three times per day if not more.  You may bend your knee as much as is comfortable.  The puncture wounds may occasionally be slightly uncomfortable with bending of the knee.  Do not let this frighten you.  It is important to keep your knee motion, but do not overdo it.  If you have significant pain, simply do not bend the knee as far.   You will be given more exercises to perform at your first return visit.   ° °RETURN APPOINTMENT °Please make an appointment to be seen by your doctor in 10-14 days from your surgery. ° °Patient Signature:  ________________________________________________________ ° °Nurse's Signature:  ________________________________________________________ ° ° °Post Anesthesia Home Care Instructions ° °Activity: °Get plenty of   rest for the remainder of the day. A responsible adult should stay with you for 24 hours following the procedure.  °For the next 24 hours, DO NOT: °-Drive a car °-Operate machinery °-Drink alcoholic beverages °-Take any medication unless instructed by your physician °-Make any legal decisions or sign important papers. ° °Meals: °Start with liquid foods such as gelatin or soup. Progress to regular foods as tolerated. Avoid greasy, spicy, heavy foods. If nausea and/or vomiting occur, drink only clear liquids until the nausea and/or vomiting subsides. Call your physician if  vomiting continues. ° °Special Instructions/Symptoms: °Your throat may feel dry or sore from the anesthesia or the breathing tube placed in your throat during surgery. If this causes discomfort, gargle with warm salt water. The discomfort should disappear within 24 hours. ° °

## 2013-11-18 ENCOUNTER — Encounter (HOSPITAL_BASED_OUTPATIENT_CLINIC_OR_DEPARTMENT_OTHER): Payer: Self-pay | Admitting: Specialist

## 2013-11-18 NOTE — Op Note (Signed)
NAME:  Antony SalmonFERGUSON, Yago            ACCOUNT NO.:  [id number]  MEDICAL RECORD NO.:  [id number]  LOCATION:                                 FACILITY:  PHYSICIAN:  Jene EveryJeffrey Lyanne Kates, M.D.    DATE OF BIRTH:  Jun 10, 1941  DATE OF PROCEDURE:  11/15/2013 DATE OF DISCHARGE:                              OPERATIVE REPORT   PREOPERATIVE DIAGNOSES:  Degenerative joint disease right knee, medial meniscus tear.  POSTOPERATIVE DIAGNOSES: 1. Medial meniscus tear. 2. Grade 4 chondromalacia medial compartment. 3. Lateral meniscal tear. 4. Grade 3 chondromalacia of patella.  PROCEDURES PERFORMED: 1. Right knee arthroscopy. 2. Partial medial and lateral meniscectomy. 3. Chondroplasty, medial femoral condyle, medial tibial plateau,     patella.  ANESTHESIA:  General.  ASSISTANT:  None.  HISTORY:  __________ pain particularly with medial compartment and mechanical symptoms, fracture, conservative treatment indicated for arthroscopy.  Risk and benefits discussed including bleeding, infection, DVT, PE, no change in symptoms, worsening, need for repeat debridement, DVT, PE, anesthetic complications, need for total knee.  TECHNIQUE:  Placed in supine position, after induction of adequate general anesthesia, 2 g Kefzol, the right lower extremity was prepped and draped in usual sterile fashion.  A lateral parapatellar portal was fashioned with a #11 blade.  Ingress cannula atraumatically placed. Irrigant was utilized to insufflate the joint.  Under direct visualization, a medial parapatellar portal was fashioned with a #11 blade after localization with an 18-gauge needle sparing the medial meniscus.  Noted was extensive grade 3 and region of grade 4 changes of the medial femoral condyle and tibial plateau and tearing of the medial meniscus as posterior half.  Introduced a Runner, broadcasting/film/videobasket and resected it to a stable base of the meniscus.  Further contoured with 3.5 Cuda shaver. Light chondroplasty performed.   Femoral condyle and tibial plateau. Currently, extensive changes of femoral condyle.  Remnant was stable to probe palpation.  The ACL was unremarkable.  Medial compartment __________ radial tears along the anterior half of the medial meniscus. This was resected to a stable base.  Some grade 2 changes of the lateral compartment.  Suprapatellar pouch.  Grade 3 changes of the patella.  Light chondroplasty performed here.  Grade 3 changes of the sulcus as well. Light chondroplasty performed here.  Normal patellofemoral tracking. Gutters unremarkable.  Revisit all compartments.  No further pathology, amenable to arthroscopic intervention.  Therefore, we removed all instrumentation.  Portals were closed with 4-0 nylon simple sutures, 0.25% Marcaine with epinephrine was infiltrated in the joint.  Wound was dressed sterilely.  Awoken without difficulty and transported to the recovery room in satisfactory condition.  The patient tolerated the procedure well.  No complications.  No blood loss.     Jene EveryJeffrey Fani Rotondo, M.D.     Cordelia PenJB/MEDQ  D:  11/15/2013  T:  11/16/2013  Job:  [id number]

## 2015-10-29 DIAGNOSIS — I440 Atrioventricular block, first degree: Secondary | ICD-10-CM

## 2015-10-29 HISTORY — DX: Atrioventricular block, first degree: I44.0

## 2018-06-08 DIAGNOSIS — I340 Nonrheumatic mitral (valve) insufficiency: Secondary | ICD-10-CM

## 2018-06-08 DIAGNOSIS — I517 Cardiomegaly: Secondary | ICD-10-CM

## 2018-06-08 DIAGNOSIS — I77810 Thoracic aortic ectasia: Secondary | ICD-10-CM

## 2018-06-08 HISTORY — DX: Cardiomegaly: I51.7

## 2018-06-08 HISTORY — DX: Nonrheumatic mitral (valve) insufficiency: I34.0

## 2018-06-08 HISTORY — DX: Thoracic aortic ectasia: I77.810

## 2018-07-03 ENCOUNTER — Ambulatory Visit: Payer: Self-pay | Admitting: Orthopedic Surgery

## 2018-07-10 ENCOUNTER — Ambulatory Visit: Payer: Self-pay | Admitting: Orthopedic Surgery

## 2018-07-10 NOTE — H&P (Signed)
TOTAL HIP ADMISSION H&P  Patient is admitted for right total hip arthroplasty.  Subjective:  Chief Complaint: right hip pain  HPI: Dominic Rogers, 77 y.o. male, has a history of pain and functional disability in the right hip(s) due to arthritis and patient has failed non-surgical conservative treatments for greater than 12 weeks to include NSAID's and/or analgesics, flexibility and strengthening excercises, use of assistive devices, weight reduction as appropriate and activity modification.  Onset of symptoms was gradual starting 2 years ago with gradually worsening course since that time.The patient noted no past surgery on the right hip(s).  Patient currently rates pain in the right hip at 10 out of 10 with activity. Patient has night pain, worsening of pain with activity and weight bearing, trendelenberg gait, pain that interfers with activities of daily living, pain with passive range of motion and joint swelling. Patient has evidence of subchondral cysts, subchondral sclerosis, periarticular osteophytes and joint space narrowing by imaging studies. This condition presents safety issues increasing the risk of falls.  There is no current active infection.  There are no active problems to display for this patient.  Past Medical History:  Diagnosis Date  . Aortic stenosis    Very Mild  . Arthritis    knees, shoulders, hands  . Ascending aorta dilatation (HCC) 06/08/2018   Mild, 3.9CM, noted on ECHO  . At risk for sleep apnea    STOP-BANG=  6   SENT TO PCP DR Lorie ApleyBRIAN MITCHELL  IN [place], [place]  . BPH (benign prostatic hypertrophy)   . Coronary artery disease    CARDIOLOGIST--  DR Serena CroissantJAN  PANAKAYA  WITH [facility]  . First degree AV block 10/29/2015   Noted on EKG  . History of ventricular tachycardia   . Hyperlipidemia   . Hypertension   . Hypothyroidism   . Iron deficiency anemia   . LAE (left atrial enlargement) 06/08/2018   Mild, noted on ECHO  . LVH (left ventricular  hypertrophy) 06/08/2018   Moderate, noted on ECHO  . Medial meniscus tear 2015   Right  . MR (mitral regurgitation) 06/08/2018   Mild, noted on ECHO  . OSA on CPAP   . PONV (postoperative nausea and vomiting)   . Right knee DJD 2015  . Right leg weakness   . S/P CABG x 4    2012  . Wears glasses     Past Surgical History:  Procedure Laterality Date  . CORONARY ARTERY BYPASS GRAFT  2012  [place]   4 vessel  . KNEE ARTHROSCOPY Right 11/15/2013   Procedure: RIGHT KNEE ARTHROSCOPY WITH DEBRIDEMENT AND PARTIAL MEDIAL AND LATERAL MENISECTOMY  ;  Surgeon: Javier DockerJeffrey C Beane, MD;  Location: [facility];  Service: Orthopedics;  Laterality: Right;  . LUMBAR SPINE SURGERY  2010  . ORIF LEFT HAND AND REATTACHEMENT TWO FINGERS  1960's  . SHOULDER OPEN ROTATOR CUFF REPAIR Right 2005    No current facility-administered medications for this visit.    No current outpatient medications on file.   Facility-Administered Medications Ordered in Other Visits  Medication Dose Route Frequency Provider Last Rate Last Dose  . 0.9 %  sodium chloride infusion   Intravenous Continuous Kholton Coate, Arlys JohnBrian, MD      . acetaminophen (OFIRMEV) IV 1,000 mg  1,000 mg Intravenous To OR Cassandr Cederberg, Arlys JohnBrian, MD      . ceFAZolin (ANCEF) IVPB 2g/100 mL premix  2 g Intravenous On Call to OR Samson FredericSwinteck, Klarissa Mcilvain, MD      .  chlorhexidine (HIBICLENS) 4 % liquid 4 application  60 mL Topical Once Tenasia Aull, Arlys John, MD      . chlorhexidine (HIBICLENS) 4 % liquid 4 application  60 mL Topical Once Samson Frederic, MD      . lactated ringers infusion   Intravenous Continuous Marcene Duos, MD 50 mL/hr at 07/19/18 0919    . tranexamic acid (CYKLOKAPRON) IVPB 1,000 mg  1,000 mg Intravenous To OR Jovanie Verge, Arlys John, MD      . vancomycin (VANCOCIN) IVPB 1000 mg/200 mL premix  1,000 mg Intravenous On Call to OR Samson Frederic, MD       Allergies  Allergen Reactions  . Morphine And Related Itching, Nausea And  Vomiting and Other (See Comments)    SEVERE    Social History   Tobacco Use  . Smoking status: Never Smoker  . Smokeless tobacco: Current User    Types: Chew  . Tobacco comment: CHEW TOBACCO SINCE TEENAGER  Substance Use Topics  . Alcohol use: Yes    Comment: SOCIAL    No family history on file.   Review of Systems  Constitutional: Negative.   HENT: Negative.   Eyes: Negative.   Respiratory: Negative.   Cardiovascular: Negative.   Gastrointestinal: Negative.   Genitourinary: Negative.   Musculoskeletal: Positive for back pain and joint pain.  Skin: Negative.     Objective:  Physical Exam  Vitals reviewed. Constitutional: He is oriented to person, place, and time. He appears well-developed and well-nourished.  HENT:  Head: Normocephalic and atraumatic.  Eyes: Pupils are equal, round, and reactive to light. Conjunctivae and EOM are normal.  Neck: Normal range of motion. Neck supple.  Cardiovascular: Normal rate, regular rhythm and intact distal pulses.  Respiratory: Effort normal. No respiratory distress.  GI: Soft. He exhibits no distension.  Genitourinary:  Genitourinary Comments: deferred  Musculoskeletal:       Right hip: He exhibits decreased range of motion, decreased strength and bony tenderness.  Neurological: He is alert and oriented to person, place, and time. He has normal reflexes.  Skin: Skin is warm and dry.  Psychiatric: He has a normal mood and affect. His behavior is normal. Judgment and thought content normal.    Vital signs in last 24 hours: @VSRANGES @  Labs:   Estimated body mass index is 29.16 kg/m as calculated from the following:   Height as of 07/12/18: 6' (1.829 m).   Weight as of 07/12/18: 97.5 kg.   Imaging Review Plain radiographs demonstrate severe degenerative joint disease of the right hip(s). The bone quality appears to be adequate for age and reported activity level.    Preoperative templating of the joint replacement has  been completed, documented, and submitted to the Operating Room personnel in order to optimize intra-operative equipment management.     Assessment/Plan:  End stage arthritis, right hip(s)  The patient history, physical examination, clinical judgement of the provider and imaging studies are consistent with end stage degenerative joint disease of the right hip(s) and total hip arthroplasty is deemed medically necessary. The treatment options including medical management, injection therapy, arthroscopy and arthroplasty were discussed at length. The risks and benefits of total hip arthroplasty were presented and reviewed. The risks due to aseptic loosening, infection, stiffness, dislocation/subluxation,  thromboembolic complications and other imponderables were discussed.  The patient acknowledged the explanation, agreed to proceed with the plan and consent was signed. Patient is being admitted for inpatient treatment for surgery, pain control, PT, OT, prophylactic antibiotics, VTE prophylaxis, progressive ambulation and  ADL's and discharge planning.The patient is planning to be discharged home with HEP

## 2018-07-10 NOTE — H&P (View-Only) (Signed)
TOTAL HIP ADMISSION H&P  Patient is admitted for right total hip arthroplasty.  Subjective:  Chief Complaint: right hip pain  HPI: Dominic Rogers, 77 y.o. male, has a history of pain and functional disability in the right hip(s) due to arthritis and patient has failed non-surgical conservative treatments for greater than 12 weeks to include NSAID's and/or analgesics, flexibility and strengthening excercises, use of assistive devices, weight reduction as appropriate and activity modification.  Onset of symptoms was gradual starting 2 years ago with gradually worsening course since that time.The patient noted no past surgery on the right hip(s).  Patient currently rates pain in the right hip at 10 out of 10 with activity. Patient has night pain, worsening of pain with activity and weight bearing, trendelenberg gait, pain that interfers with activities of daily living, pain with passive range of motion and joint swelling. Patient has evidence of subchondral cysts, subchondral sclerosis, periarticular osteophytes and joint space narrowing by imaging studies. This condition presents safety issues increasing the risk of falls.  There is no current active infection.  There are no active problems to display for this patient.  Past Medical History:  Diagnosis Date  . Aortic stenosis    Very Mild  . Arthritis    knees, shoulders, hands  . Ascending aorta dilatation (HCC) 06/08/2018   Mild, 3.9CM, noted on ECHO  . At risk for sleep apnea    STOP-BANG=  6   SENT TO PCP DR Gerene Nedd MITCHELL  IN [place], [place]  . BPH (benign prostatic hypertrophy)   . Coronary artery disease    CARDIOLOGIST--  DR JAN  PANAKAYA  WITH [facility]  . First degree AV block 10/29/2015   Noted on EKG  . History of ventricular tachycardia   . Hyperlipidemia   . Hypertension   . Hypothyroidism   . Iron deficiency anemia   . LAE (left atrial enlargement) 06/08/2018   Mild, noted on ECHO  . LVH (left ventricular  hypertrophy) 06/08/2018   Moderate, noted on ECHO  . Medial meniscus tear 2015   Right  . MR (mitral regurgitation) 06/08/2018   Mild, noted on ECHO  . OSA on CPAP   . PONV (postoperative nausea and vomiting)   . Right knee DJD 2015  . Right leg weakness   . S/P CABG x 4    2012  . Wears glasses     Past Surgical History:  Procedure Laterality Date  . CORONARY ARTERY BYPASS GRAFT  2012  mission hospital in [place]   4 vessel  . KNEE ARTHROSCOPY Right 11/15/2013   Procedure: RIGHT KNEE ARTHROSCOPY WITH DEBRIDEMENT AND PARTIAL MEDIAL AND LATERAL MENISECTOMY  ;  Surgeon: Jeffrey C Beane, MD;  Location: [facility];  Service: Orthopedics;  Laterality: Right;  . LUMBAR SPINE SURGERY  2010  . ORIF LEFT HAND AND REATTACHEMENT TWO FINGERS  1960's  . SHOULDER OPEN ROTATOR CUFF REPAIR Right 2005    No current facility-administered medications for this visit.    No current outpatient medications on file.   Facility-Administered Medications Ordered in Other Visits  Medication Dose Route Frequency Provider Last Rate Last Dose  . 0.9 %  sodium chloride infusion   Intravenous Continuous Timmie Calix, MD      . acetaminophen (OFIRMEV) IV 1,000 mg  1,000 mg Intravenous To OR Jeily Guthridge, MD      . ceFAZolin (ANCEF) IVPB 2g/100 mL premix  2 g Intravenous On Call to OR Erdem Naas, MD      .   chlorhexidine (HIBICLENS) 4 % liquid 4 application  60 mL Topical Once Ariell Gunnels, MD      . chlorhexidine (HIBICLENS) 4 % liquid 4 application  60 mL Topical Once Beckhem Isadore, MD      . lactated ringers infusion   Intravenous Continuous Fitzgerald, Robert, MD 50 mL/hr at 07/19/18 0919    . tranexamic acid (CYKLOKAPRON) IVPB 1,000 mg  1,000 mg Intravenous To OR Blakely Gluth, MD      . vancomycin (VANCOCIN) IVPB 1000 mg/200 mL premix  1,000 mg Intravenous On Call to OR Jatavia Keltner, MD       Allergies  Allergen Reactions  . Morphine And Related Itching, Nausea And  Vomiting and Other (See Comments)    SEVERE    Social History   Tobacco Use  . Smoking status: Never Smoker  . Smokeless tobacco: Current User    Types: Chew  . Tobacco comment: CHEW TOBACCO SINCE TEENAGER  Substance Use Topics  . Alcohol use: Yes    Comment: SOCIAL    No family history on file.   Review of Systems  Constitutional: Negative.   HENT: Negative.   Eyes: Negative.   Respiratory: Negative.   Cardiovascular: Negative.   Gastrointestinal: Negative.   Genitourinary: Negative.   Musculoskeletal: Positive for back pain and joint pain.  Skin: Negative.     Objective:  Physical Exam  Vitals reviewed. Constitutional: He is oriented to person, place, and time. He appears well-developed and well-nourished.  HENT:  Head: Normocephalic and atraumatic.  Eyes: Pupils are equal, round, and reactive to light. Conjunctivae and EOM are normal.  Neck: Normal range of motion. Neck supple.  Cardiovascular: Normal rate, regular rhythm and intact distal pulses.  Respiratory: Effort normal. No respiratory distress.  GI: Soft. He exhibits no distension.  Genitourinary:  Genitourinary Comments: deferred  Musculoskeletal:       Right hip: He exhibits decreased range of motion, decreased strength and bony tenderness.  Neurological: He is alert and oriented to person, place, and time. He has normal reflexes.  Skin: Skin is warm and dry.  Psychiatric: He has a normal mood and affect. His behavior is normal. Judgment and thought content normal.    Vital signs in last 24 hours: @VSRANGES@  Labs:   Estimated body mass index is 29.16 kg/m as calculated from the following:   Height as of 07/12/18: 6' (1.829 m).   Weight as of 07/12/18: 97.5 kg.   Imaging Review Plain radiographs demonstrate severe degenerative joint disease of the right hip(s). The bone quality appears to be adequate for age and reported activity level.    Preoperative templating of the joint replacement has  been completed, documented, and submitted to the Operating Room personnel in order to optimize intra-operative equipment management.     Assessment/Plan:  End stage arthritis, right hip(s)  The patient history, physical examination, clinical judgement of the provider and imaging studies are consistent with end stage degenerative joint disease of the right hip(s) and total hip arthroplasty is deemed medically necessary. The treatment options including medical management, injection therapy, arthroscopy and arthroplasty were discussed at length. The risks and benefits of total hip arthroplasty were presented and reviewed. The risks due to aseptic loosening, infection, stiffness, dislocation/subluxation,  thromboembolic complications and other imponderables were discussed.  The patient acknowledged the explanation, agreed to proceed with the plan and consent was signed. Patient is being admitted for inpatient treatment for surgery, pain control, PT, OT, prophylactic antibiotics, VTE prophylaxis, progressive ambulation and   ADL's and discharge planning.The patient is planning to be discharged home with HEP 

## 2018-07-11 ENCOUNTER — Encounter (HOSPITAL_COMMUNITY): Payer: Self-pay

## 2018-07-11 NOTE — Pre-Procedure Instructions (Signed)
The following are in chart: Cardiac clearance and last office visit note Dr. Westley HummerPattanayak 06/08/2018 Dr. Alben SpittleWeaver medical clearance 06/05/2018  ECHO report 06/08/2018

## 2018-07-11 NOTE — Patient Instructions (Signed)
Your procedure is scheduled on: Thursday, Dec. 12, 2019   Surgery Time:  10:42AM-1:22PM   Report to [facility] Main  Entrance    Report to admitting at 8:15 AM   Call this number if you have problems the morning of surgery [phone]   Bring CPAP mask and tubing day of surgery   Do not eat food or drink liquids :After Midnight.   Brush your teeth the morning of surgery.   Do NOT smoke after Midnight   Take these medicines the morning of surgery with A SIP OF WATER: Atorvastatin, Levothyroxine   May use Flonase if needed                               You may not have any metal on your body including jewelry, and body piercings             Do not wear lotions, powders, perfumes/cologne, or deodorant                          Men may shave face and neck.   Do not bring valuables to the hospital.  IS NOT             RESPONSIBLE   FOR VALUABLES.   Contacts, dentures or bridgework may not be worn into surgery.   Leave suitcase in the car. After surgery it may be brought to your room.   Special Instructions: Bring a copy of your healthcare power of attorney and living will documents         the day of surgery if you haven't scanned them in before.              Please read over the following fact sheets you were given:  [facility] Pre[facility] Before surgery, you can play an important role.  Because skin is not sterile, your skin needs to be as free of germs as possible.  You can reduce the number of germs on your skin by washing with CHG (chlorahexidine gluconate) soap before surgery.  CHG is an antiseptic cleaner which kills germs and bonds with the skin to continue killing germs even after washing. Please DO NOT use if you have an allergy to CHG or antibacterial soaps.  If your skin becomes reddened/irritated stop using the CHG and inform your nurse when you arrive at Short Stay. Do not shave (including legs and underarms) for at least 48  hours prior to the first CHG shower.  You may shave your face/neck.  Please follow these instructions carefully:  1.  Shower with CHG Soap the night before surgery and the  morning of surgery.  2.  If you choose to wash your hair, wash your hair first as usual with your normal  shampoo.  3.  After you shampoo, rinse your hair and body thoroughly to remove the shampoo.                             4.  Use CHG as you would any other liquid soap.  You can apply chg directly to the skin and wash.  Gently with a scrungie or clean washcloth.  5.  Apply the CHG Soap to your body ONLY FROM THE NECK DOWN.   Do   not use on face/ open  Wound or open sores. Avoid contact with eyes, ears mouth and   genitals (private parts).                       Wash face,  Genitals (private parts) with your normal soap.             6.  Wash thoroughly, paying special attention to the area where your    surgery  will be performed.  7.  Thoroughly rinse your body with warm water from the neck down.  8.  DO NOT shower/wash with your normal soap after using and rinsing off the CHG Soap.                9.  Pat yourself dry with a clean towel.            10.  Wear clean pajamas.            11.  Place clean sheets on your bed the night of your first shower and do not  sleep with pets. Day of Surgery : Do not apply any lotions/deodorants the morning of surgery.  Please wear clean clothes to the hospital/surgery center.  FAILURE TO FOLLOW THESE INSTRUCTIONS MAY RESULT IN THE CANCELLATION OF YOUR SURGERY  PATIENT SIGNATURE_________________________________  NURSE SIGNATURE__________________________________  ________________________________________________________________________   Dominic Rogers  An incentive spirometer is a tool that can help keep your lungs clear and active. This tool measures how well you are filling your lungs with each breath. Taking long deep breaths may help reverse or  decrease the chance of developing breathing (pulmonary) problems (especially infection) following:  A long period of time when you are unable to move or be active. BEFORE THE PROCEDURE   If the spirometer includes an indicator to show your best effort, your nurse or respiratory therapist will set it to a desired goal.  If possible, sit up straight or lean slightly forward. Try not to slouch.  Hold the incentive spirometer in an upright position. INSTRUCTIONS FOR USE  1. Sit on the edge of your bed if possible, or sit up as far as you can in bed or on a chair. 2. Hold the incentive spirometer in an upright position. 3. Breathe out normally. 4. Place the mouthpiece in your mouth and seal your lips tightly around it. 5. Breathe in slowly and as deeply as possible, raising the piston or the ball toward the top of the column. 6. Hold your breath for 3-5 seconds or for as long as possible. Allow the piston or ball to fall to the bottom of the column. 7. Remove the mouthpiece from your mouth and breathe out normally. 8. Rest for a few seconds and repeat Steps 1 through 7 at least 10 times every 1-2 hours when you are awake. Take your time and take a few normal breaths between deep breaths. 9. The spirometer may include an indicator to show your best effort. Use the indicator as a goal to work toward during each repetition. 10. After each set of 10 deep breaths, practice coughing to be sure your lungs are clear. If you have an incision (the cut made at the time of surgery), support your incision when coughing by placing a pillow or rolled up towels firmly against it. Once you are able to get out of bed, walk around indoors and cough well. You may stop using the incentive spirometer when instructed by your caregiver.  RISKS AND COMPLICATIONS  Take your time  so you do not get dizzy or light-headed.  If you are in pain, you may need to take or ask for pain medication before doing incentive spirometry.  It is harder to take a deep breath if you are having pain. AFTER USE  Rest and breathe slowly and easily.  It can be helpful to keep track of a log of your progress. Your caregiver can provide you with a simple table to help with this. If you are using the spirometer at home, follow these instructions: [place] IF:   You are having difficultly using the spirometer.  You have trouble using the spirometer as often as instructed.  Your pain medication is not giving enough relief while using the spirometer.  You develop fever of 100.5 F (38.1 C) or higher. SEEK IMMEDIATE MEDICAL CARE IF:   You cough up bloody sputum that had not been present before.  You develop fever of 102 F (38.9 C) or greater.  You develop worsening pain at or near the incision site. MAKE SURE YOU:   Understand these instructions.  Will watch your condition.  Will get help right away if you are not doing well or get worse. Document Released: 12/05/2006 Document Revised: 10/17/2011 Document Reviewed: 02/05/2007 ExitCare Patient Information 2014 ExitCare, [place].   ________________________________________________________________________  WHAT IS A BLOOD TRANSFUSION? Blood Transfusion Information  A transfusion is the replacement of blood or some of its parts. Blood is made up of multiple cells which provide different functions.  Red blood cells carry oxygen and are used for blood loss replacement.  White blood cells fight against infection.  Platelets control bleeding.  Plasma helps clot blood.  Other blood products are available for specialized needs, such as hemophilia or other clotting disorders. BEFORE THE TRANSFUSION  Who gives blood for transfusions?   Healthy volunteers who are fully evaluated to make sure their blood is safe. This is blood bank blood. Transfusion therapy is the safest it has ever been in the practice of medicine. Before blood is taken from a donor, a complete  history is taken to make sure that person has no history of diseases nor engages in risky social behavior (examples are intravenous drug use or sexual activity with multiple partners). The donor's travel history is screened to minimize risk of transmitting infections, such as malaria. The donated blood is tested for signs of infectious diseases, such as HIV and hepatitis. The blood is then tested to be sure it is compatible with you in order to minimize the chance of a transfusion reaction. If you or a relative donates blood, this is often done in anticipation of surgery and is not appropriate for emergency situations. It takes many days to process the donated blood. RISKS AND COMPLICATIONS Although transfusion therapy is very safe and saves many lives, the main dangers of transfusion include:   Getting an infectious disease.  Developing a transfusion reaction. This is an allergic reaction to something in the blood you were given. Every precaution is taken to prevent this. The decision to have a blood transfusion has been considered carefully by your caregiver before blood is given. Blood is not given unless the benefits outweigh the risks. AFTER THE TRANSFUSION  Right after receiving a blood transfusion, you will usually feel much better and more energetic. This is especially true if your red blood cells have gotten low (anemic). The transfusion raises the level of the red blood cells which carry oxygen, and this usually causes an energy increase.  The  nurse administering the transfusion will monitor you carefully for complications. HOME CARE INSTRUCTIONS  No special instructions are needed after a transfusion. You may find your energy is better. Speak with your caregiver about any limitations on activity for underlying diseases you may have. SEEK MEDICAL CARE IF:   Your condition is not improving after your transfusion.  You develop redness or irritation at the intravenous (IV) site. SEEK  IMMEDIATE MEDICAL CARE IF:  Any of the following symptoms occur over the next 12 hours:  Shaking chills.  You have a temperature by mouth above 102 F (38.9 C), not controlled by medicine.  Chest, back, or muscle pain.  People around you feel you are not acting correctly or are confused.  Shortness of breath or difficulty breathing.  Dizziness and fainting.  You get a rash or develop hives.  You have a decrease in urine output.  Your urine turns a dark color or changes to pink, red, or brown. Any of the following symptoms occur over the next 10 days:  You have a temperature by mouth above 102 F (38.9 C), not controlled by medicine.  Shortness of breath.  Weakness after normal activity.  The white part of the eye turns yellow (jaundice).  You have a decrease in the amount of urine or are urinating less often.  Your urine turns a dark color or changes to pink, red, or brown. Document Released: 07/22/2000 Document Revised: 10/17/2011 Document Reviewed: 03/10/2008 [employer] Patient Information 2014 [place], [place].  _______________________________________________________________________

## 2018-07-12 ENCOUNTER — Encounter (HOSPITAL_COMMUNITY)
Admission: RE | Admit: 2018-07-12 | Discharge: 2018-07-12 | Disposition: A | Discharge status: Home / Self Care | Payer: Medicare Other | Source: Ambulatory Visit | Attending: Orthopedic Surgery | Admitting: Orthopedic Surgery

## 2018-07-12 ENCOUNTER — Encounter (HOSPITAL_COMMUNITY): Payer: Self-pay

## 2018-07-12 ENCOUNTER — Other Ambulatory Visit: Payer: Self-pay

## 2018-07-12 DIAGNOSIS — Z01810 Encounter for preprocedural cardiovascular examination: Secondary | ICD-10-CM | POA: Insufficient documentation

## 2018-07-12 DIAGNOSIS — R001 Bradycardia, unspecified: Secondary | ICD-10-CM | POA: Insufficient documentation

## 2018-07-12 HISTORY — DX: Thoracic aortic ectasia: I77.810

## 2018-07-12 HISTORY — DX: Dependence on other enabling machines and devices: Z99.89

## 2018-07-12 HISTORY — DX: Personal history of other diseases of the circulatory system: Z86.79

## 2018-07-12 HISTORY — DX: Cardiomegaly: I51.7

## 2018-07-12 HISTORY — DX: Other symptoms and signs involving the musculoskeletal system: R29.898

## 2018-07-12 HISTORY — DX: Atrioventricular block, first degree: I44.0

## 2018-07-12 HISTORY — DX: Nonrheumatic mitral (valve) insufficiency: I34.0

## 2018-07-12 HISTORY — DX: Other tear of medial meniscus, current injury, unspecified knee, initial encounter: S83.249A

## 2018-07-12 HISTORY — DX: Nonrheumatic aortic (valve) stenosis: I35.0

## 2018-07-12 HISTORY — DX: Obstructive sleep apnea (adult) (pediatric): G47.33

## 2018-07-12 LAB — TYPE AND SCREEN
ABO/RH(D): A POS
ANTIBODY SCREEN: NEGATIVE

## 2018-07-12 LAB — CBC
HCT: 37.3 % — ABNORMAL LOW (ref 39.0–52.0)
Hemoglobin: 12.1 g/dL — ABNORMAL LOW (ref 13.0–17.0)
MCH: 32.5 pg (ref 26.0–34.0)
MCHC: 32.4 g/dL (ref 30.0–36.0)
MCV: 100.3 fL — ABNORMAL HIGH (ref 80.0–100.0)
PLATELETS: 248 10*3/uL (ref 150–400)
RBC: 3.72 MIL/uL — ABNORMAL LOW (ref 4.22–5.81)
RDW: 12.8 % (ref 11.5–15.5)
WBC: 8.6 10*3/uL (ref 4.0–10.5)
nRBC: 0 % (ref 0.0–0.2)

## 2018-07-12 LAB — BASIC METABOLIC PANEL
Anion gap: 8 (ref 5–15)
BUN: 28 mg/dL — ABNORMAL HIGH (ref 8–23)
CALCIUM: 9.5 mg/dL (ref 8.9–10.3)
CO2: 26 mmol/L (ref 22–32)
Chloride: 107 mmol/L (ref 98–111)
Creatinine, Ser: 1.16 mg/dL (ref 0.61–1.24)
GFR calc Af Amer: >60 mL/min (ref >60)
Glucose, Bld: 103 mg/dL — ABNORMAL HIGH (ref 70–99)
Potassium: 3.7 mmol/L (ref 3.5–5.1)
SODIUM: 141 mmol/L (ref 135–145)

## 2018-07-12 NOTE — Pre-Procedure Instructions (Signed)
CBC AND BMP RESULTS 07/12/2018 SENT TO DR. Linna CapriceSWINTECK VIA Epic.

## 2018-07-13 LAB — SURGICAL PCR SCREEN
MRSA, PCR: POSITIVE — AB
Staphylococcus aureus: POSITIVE — AB

## 2018-07-13 LAB — ABO/RH: ABO/RH(D): A POS

## 2018-07-13 NOTE — Pre-Procedure Instructions (Signed)
PCR results sent to Dr. Linna CapriceSwinteck via epic.  Mupirocin called in to pharmacy.  Mr. Dominic Rogers made aware to pick up and begin prescription to complete prior to surgery.

## 2018-07-19 ENCOUNTER — Inpatient Hospital Stay (HOSPITAL_COMMUNITY): Payer: Medicare Other

## 2018-07-19 ENCOUNTER — Other Ambulatory Visit: Payer: Self-pay

## 2018-07-19 ENCOUNTER — Inpatient Hospital Stay (HOSPITAL_COMMUNITY)
Admission: RE | Admit: 2018-07-19 | Discharge: 2018-07-20 | DRG: 470 | Disposition: A | Discharge status: Home / Self Care | Payer: Medicare Other | Attending: Orthopedic Surgery | Admitting: Orthopedic Surgery

## 2018-07-19 ENCOUNTER — Inpatient Hospital Stay (HOSPITAL_COMMUNITY): Payer: Medicare Other | Admitting: Certified Registered"

## 2018-07-19 ENCOUNTER — Encounter (HOSPITAL_COMMUNITY): Payer: Self-pay | Admitting: Certified Registered"

## 2018-07-19 ENCOUNTER — Encounter (HOSPITAL_COMMUNITY)
Admission: RE | Disposition: A | Discharge status: Home / Self Care | Payer: Self-pay | Source: Home / Self Care | Attending: Orthopedic Surgery

## 2018-07-19 DIAGNOSIS — I10 Essential (primary) hypertension: Secondary | ICD-10-CM | POA: Diagnosis present

## 2018-07-19 DIAGNOSIS — I2510 Atherosclerotic heart disease of native coronary artery without angina pectoris: Secondary | ICD-10-CM | POA: Diagnosis present

## 2018-07-19 DIAGNOSIS — Z951 Presence of aortocoronary bypass graft: Secondary | ICD-10-CM

## 2018-07-19 DIAGNOSIS — Z22322 Carrier or suspected carrier of Methicillin resistant Staphylococcus aureus: Secondary | ICD-10-CM

## 2018-07-19 DIAGNOSIS — M1611 Unilateral primary osteoarthritis, right hip: Secondary | ICD-10-CM | POA: Diagnosis present

## 2018-07-19 DIAGNOSIS — F17220 Nicotine dependence, chewing tobacco, uncomplicated: Secondary | ICD-10-CM | POA: Diagnosis present

## 2018-07-19 DIAGNOSIS — Z419 Encounter for procedure for purposes other than remedying health state, unspecified: Secondary | ICD-10-CM

## 2018-07-19 DIAGNOSIS — Z885 Allergy status to narcotic agent status: Secondary | ICD-10-CM

## 2018-07-19 DIAGNOSIS — E039 Hypothyroidism, unspecified: Secondary | ICD-10-CM | POA: Diagnosis present

## 2018-07-19 DIAGNOSIS — Z09 Encounter for follow-up examination after completed treatment for conditions other than malignant neoplasm: Secondary | ICD-10-CM

## 2018-07-19 DIAGNOSIS — I77810 Thoracic aortic ectasia: Secondary | ICD-10-CM | POA: Diagnosis present

## 2018-07-19 DIAGNOSIS — M25551 Pain in right hip: Secondary | ICD-10-CM | POA: Diagnosis present

## 2018-07-19 DIAGNOSIS — G4733 Obstructive sleep apnea (adult) (pediatric): Secondary | ICD-10-CM | POA: Diagnosis present

## 2018-07-19 HISTORY — PX: TOTAL HIP ARTHROPLASTY: SHX124

## 2018-07-19 SURGERY — ARTHROPLASTY, HIP, TOTAL, ANTERIOR APPROACH
Anesthesia: Regional | Site: Hip | Laterality: Right

## 2018-07-19 MED ORDER — METOCLOPRAMIDE HCL 5 MG PO TABS: 5.0000 mg | ORAL_TABLET | Freq: Three times a day (TID) | ORAL | Status: DC | PRN

## 2018-07-19 MED ORDER — DEXAMETHASONE SODIUM PHOSPHATE 10 MG/ML IJ SOLN
10.0000 mg | Freq: Once | INTRAMUSCULAR | Status: AC
  Administered 2018-07-20: 10 mg via INTRAVENOUS
  Filled 2018-07-19: qty 1

## 2018-07-19 MED ORDER — EPHEDRINE 5 MG/ML INJ
INTRAVENOUS | Status: AC
  Filled 2018-07-19: qty 10

## 2018-07-19 MED ORDER — VANCOMYCIN HCL IN DEXTROSE 1-5 GM/200ML-% IV SOLN
1000.0000 mg | INTRAVENOUS | Status: AC
  Administered 2018-07-19: 1000 mg via INTRAVENOUS
  Filled 2018-07-19: qty 200

## 2018-07-19 MED ORDER — HYDROMORPHONE HCL 2 MG/ML IJ SOLN
INTRAMUSCULAR | Status: AC
  Filled 2018-07-19: qty 1

## 2018-07-19 MED ORDER — SUGAMMADEX SODIUM 200 MG/2ML IV SOLN
INTRAVENOUS | Status: DC | PRN
  Administered 2018-07-19: 200 mg via INTRAVENOUS

## 2018-07-19 MED ORDER — ONDANSETRON HCL 4 MG/2ML IJ SOLN
INTRAMUSCULAR | Status: DC | PRN
  Administered 2018-07-19: 4 mg via INTRAVENOUS

## 2018-07-19 MED ORDER — ATORVASTATIN CALCIUM 20 MG PO TABS: 20.0000 mg | ORAL_TABLET | Freq: Every day | ORAL | Status: DC

## 2018-07-19 MED ORDER — LEVOTHYROXINE SODIUM 125 MCG PO TABS
125.0000 ug | ORAL_TABLET | Freq: Every day | ORAL | Status: DC
  Filled 2018-07-19: qty 1

## 2018-07-19 MED ORDER — EPHEDRINE SULFATE-NACL 50-0.9 MG/10ML-% IV SOSY
PREFILLED_SYRINGE | INTRAVENOUS | Status: DC | PRN
  Administered 2018-07-19: 5 mg via INTRAVENOUS
  Administered 2018-07-19: 10 mg via INTRAVENOUS

## 2018-07-19 MED ORDER — WATER FOR IRRIGATION, STERILE IR SOLN
Status: DC | PRN
  Administered 2018-07-19: 2000 mL

## 2018-07-19 MED ORDER — MORPHINE SULFATE (PF) 2 MG/ML IV SOLN: 0.5000 mg | INTRAVENOUS | Status: DC | PRN

## 2018-07-19 MED ORDER — POLYETHYLENE GLYCOL 3350 17 G PO PACK: 17.0000 g | PACK | Freq: Every day | ORAL | Status: DC | PRN

## 2018-07-19 MED ORDER — MENTHOL 3 MG MT LOZG: 1.0000 | LOZENGE | OROMUCOSAL | Status: DC | PRN

## 2018-07-19 MED ORDER — HYDROCODONE-ACETAMINOPHEN 7.5-325 MG PO TABS: 1.0000 | ORAL_TABLET | ORAL | Status: DC | PRN

## 2018-07-19 MED ORDER — FENTANYL CITRATE (PF) 100 MCG/2ML IJ SOLN: 25.0000 ug | INTRAMUSCULAR | Status: DC | PRN

## 2018-07-19 MED ORDER — METOCLOPRAMIDE HCL 5 MG/ML IJ SOLN: 5.0000 mg | Freq: Three times a day (TID) | INTRAMUSCULAR | Status: DC | PRN

## 2018-07-19 MED ORDER — TRANEXAMIC ACID-NACL 1000-0.7 MG/100ML-% IV SOLN
1000.0000 mg | INTRAVENOUS | Status: AC
  Administered 2018-07-19: 1000 mg via INTRAVENOUS
  Filled 2018-07-19: qty 100

## 2018-07-19 MED ORDER — PHENOL 1.4 % MT LIQD: 1.0000 | OROMUCOSAL | Status: DC | PRN

## 2018-07-19 MED ORDER — FENTANYL CITRATE (PF) 100 MCG/2ML IJ SOLN
INTRAMUSCULAR | Status: DC | PRN
  Administered 2018-07-19: 100 ug via INTRAVENOUS
  Administered 2018-07-19 (×2): 50 ug via INTRAVENOUS

## 2018-07-19 MED ORDER — LACTATED RINGERS IV SOLN
INTRAVENOUS | Status: DC
  Administered 2018-07-19 (×2): via INTRAVENOUS

## 2018-07-19 MED ORDER — DIPHENHYDRAMINE HCL 12.5 MG/5ML PO ELIX: 12.5000 mg | ORAL_SOLUTION | ORAL | Status: DC | PRN

## 2018-07-19 MED ORDER — IRBESARTAN 150 MG PO TABS
300.0000 mg | ORAL_TABLET | Freq: Every day | ORAL | Status: DC
  Administered 2018-07-19: 300 mg via ORAL
  Filled 2018-07-19 (×2): qty 2

## 2018-07-19 MED ORDER — DOCUSATE SODIUM 100 MG PO CAPS
100.0000 mg | ORAL_CAPSULE | Freq: Two times a day (BID) | ORAL | Status: DC
  Administered 2018-07-19 – 2018-07-20 (×2): 100 mg via ORAL
  Filled 2018-07-19 (×2): qty 1

## 2018-07-19 MED ORDER — LIDOCAINE 2% (20 MG/ML) 5 ML SYRINGE
INTRAMUSCULAR | Status: DC | PRN
  Administered 2018-07-19: 100 mg via INTRAVENOUS
  Administered 2018-07-19: 50 mg via INTRAVENOUS

## 2018-07-19 MED ORDER — SODIUM CHLORIDE 0.9 % IR SOLN
Status: DC | PRN
  Administered 2018-07-19 (×2): 1000 mL
  Administered 2018-07-19: 3000 mL

## 2018-07-19 MED ORDER — HYDROCODONE-ACETAMINOPHEN 5-325 MG PO TABS
1.0000 | ORAL_TABLET | ORAL | Status: DC | PRN
  Administered 2018-07-19 – 2018-07-20 (×2): 1 via ORAL
  Filled 2018-07-19 (×2): qty 1

## 2018-07-19 MED ORDER — HYDROCHLOROTHIAZIDE 25 MG PO TABS
25.0000 mg | ORAL_TABLET | Freq: Every morning | ORAL | Status: DC
  Filled 2018-07-19: qty 1

## 2018-07-19 MED ORDER — VANCOMYCIN HCL IN DEXTROSE 1-5 GM/200ML-% IV SOLN
1000.0000 mg | Freq: Two times a day (BID) | INTRAVENOUS | Status: AC
  Administered 2018-07-19: 1000 mg via INTRAVENOUS
  Filled 2018-07-19: qty 200

## 2018-07-19 MED ORDER — BUPIVACAINE-EPINEPHRINE 0.25% -1:200000 IJ SOLN
INTRAMUSCULAR | Status: DC | PRN
  Administered 2018-07-19: 30 mL

## 2018-07-19 MED ORDER — ONDANSETRON HCL 4 MG PO TABS: 4.0000 mg | ORAL_TABLET | Freq: Four times a day (QID) | ORAL | Status: DC | PRN

## 2018-07-19 MED ORDER — LIDOCAINE 2% (20 MG/ML) 5 ML SYRINGE
INTRAMUSCULAR | Status: AC
  Filled 2018-07-19: qty 5

## 2018-07-19 MED ORDER — SODIUM CHLORIDE 0.9 % IV SOLN
INTRAVENOUS | Status: DC
  Administered 2018-07-19 (×2): via INTRAVENOUS

## 2018-07-19 MED ORDER — PROPOFOL 10 MG/ML IV BOLUS
INTRAVENOUS | Status: DC | PRN
  Administered 2018-07-19 (×2): 20 mg via INTRAVENOUS
  Administered 2018-07-19: 160 mg via INTRAVENOUS

## 2018-07-19 MED ORDER — FLUTICASONE PROPIONATE 50 MCG/ACT NA SUSP
1.0000 | Freq: Every day | NASAL | Status: DC | PRN
  Filled 2018-07-19: qty 16

## 2018-07-19 MED ORDER — PHENYLEPHRINE 40 MCG/ML (10ML) SYRINGE FOR IV PUSH (FOR BLOOD PRESSURE SUPPORT)
PREFILLED_SYRINGE | INTRAVENOUS | Status: AC
  Filled 2018-07-19: qty 10

## 2018-07-19 MED ORDER — BUPIVACAINE-EPINEPHRINE (PF) 0.25% -1:200000 IJ SOLN
INTRAMUSCULAR | Status: AC
  Filled 2018-07-19: qty 30

## 2018-07-19 MED ORDER — KETOROLAC TROMETHAMINE 30 MG/ML IJ SOLN
INTRAMUSCULAR | Status: AC
  Filled 2018-07-19: qty 1

## 2018-07-19 MED ORDER — METHOCARBAMOL 500 MG IVPB - SIMPLE MED
500.0000 mg | Freq: Four times a day (QID) | INTRAVENOUS | Status: DC | PRN
  Filled 2018-07-19: qty 50

## 2018-07-19 MED ORDER — POVIDONE-IODINE 10 % EX SWAB
2.0000 "application " | Freq: Once | CUTANEOUS | Status: AC
  Administered 2018-07-19: 2 via TOPICAL

## 2018-07-19 MED ORDER — SODIUM CHLORIDE 0.9 % IV SOLN: INTRAVENOUS | Status: DC

## 2018-07-19 MED ORDER — METHOCARBAMOL 500 MG PO TABS
500.0000 mg | ORAL_TABLET | Freq: Four times a day (QID) | ORAL | Status: DC | PRN
  Administered 2018-07-19: 500 mg via ORAL
  Filled 2018-07-19: qty 1

## 2018-07-19 MED ORDER — ACETAMINOPHEN 325 MG PO TABS: 325.0000 mg | ORAL_TABLET | Freq: Four times a day (QID) | ORAL | Status: DC | PRN

## 2018-07-19 MED ORDER — DEXAMETHASONE SODIUM PHOSPHATE 10 MG/ML IJ SOLN
INTRAMUSCULAR | Status: DC | PRN
  Administered 2018-07-19: 10 mg via INTRAVENOUS

## 2018-07-19 MED ORDER — PROPOFOL 10 MG/ML IV BOLUS
INTRAVENOUS | Status: AC
  Filled 2018-07-19: qty 20

## 2018-07-19 MED ORDER — ASPIRIN 81 MG PO CHEW
81.0000 mg | CHEWABLE_TABLET | Freq: Two times a day (BID) | ORAL | Status: DC
  Administered 2018-07-19 – 2018-07-20 (×2): 81 mg via ORAL
  Filled 2018-07-19 (×2): qty 1

## 2018-07-19 MED ORDER — PROPOFOL 10 MG/ML IV BOLUS
INTRAVENOUS | Status: AC
  Filled 2018-07-19: qty 40

## 2018-07-19 MED ORDER — CEFAZOLIN SODIUM-DEXTROSE 2-4 GM/100ML-% IV SOLN
2.0000 g | INTRAVENOUS | Status: AC
  Administered 2018-07-19: 2 g via INTRAVENOUS
  Filled 2018-07-19: qty 100

## 2018-07-19 MED ORDER — ONDANSETRON HCL 4 MG/2ML IJ SOLN: 4.0000 mg | Freq: Four times a day (QID) | INTRAMUSCULAR | Status: DC | PRN

## 2018-07-19 MED ORDER — SENNA 8.6 MG PO TABS
1.0000 | ORAL_TABLET | Freq: Two times a day (BID) | ORAL | Status: DC
  Administered 2018-07-19 – 2018-07-20 (×2): 8.6 mg via ORAL
  Filled 2018-07-19 (×2): qty 1

## 2018-07-19 MED ORDER — ALUM & MAG HYDROXIDE-SIMETH 200-200-20 MG/5ML PO SUSP: 30.0000 mL | ORAL | Status: DC | PRN

## 2018-07-19 MED ORDER — KETOROLAC TROMETHAMINE 15 MG/ML IJ SOLN
7.5000 mg | Freq: Four times a day (QID) | INTRAMUSCULAR | Status: DC
  Administered 2018-07-19 – 2018-07-20 (×3): 7.5 mg via INTRAVENOUS
  Filled 2018-07-19 (×2): qty 1

## 2018-07-19 MED ORDER — HYDROMORPHONE HCL 1 MG/ML IJ SOLN
INTRAMUSCULAR | Status: DC | PRN
  Administered 2018-07-19: 0.5 mg via INTRAVENOUS
  Administered 2018-07-19: 1 mg via INTRAVENOUS
  Administered 2018-07-19: 0.5 mg via INTRAVENOUS

## 2018-07-19 MED ORDER — SODIUM CHLORIDE (PF) 0.9 % IJ SOLN
INTRAMUSCULAR | Status: AC
  Filled 2018-07-19: qty 50

## 2018-07-19 MED ORDER — FENTANYL CITRATE (PF) 100 MCG/2ML IJ SOLN
INTRAMUSCULAR | Status: AC
  Filled 2018-07-19: qty 2

## 2018-07-19 MED ORDER — KETOROLAC TROMETHAMINE 30 MG/ML IJ SOLN
INTRAMUSCULAR | Status: DC | PRN
  Administered 2018-07-19: 30 mg

## 2018-07-19 MED ORDER — SUGAMMADEX SODIUM 200 MG/2ML IV SOLN
INTRAVENOUS | Status: AC
  Filled 2018-07-19: qty 2

## 2018-07-19 MED ORDER — HYDROMORPHONE HCL 1 MG/ML IJ SOLN: 0.5000 mg | INTRAMUSCULAR | Status: DC | PRN

## 2018-07-19 MED ORDER — CHLORHEXIDINE GLUCONATE 4 % EX LIQD: 60.0000 mL | Freq: Once | CUTANEOUS | Status: DC

## 2018-07-19 MED ORDER — ROCURONIUM BROMIDE 10 MG/ML (PF) SYRINGE
PREFILLED_SYRINGE | INTRAVENOUS | Status: DC | PRN
  Administered 2018-07-19: 50 mg via INTRAVENOUS

## 2018-07-19 MED ORDER — ACETAMINOPHEN 10 MG/ML IV SOLN
1000.0000 mg | INTRAVENOUS | Status: AC
  Administered 2018-07-19: 1000 mg via INTRAVENOUS
  Filled 2018-07-19: qty 100

## 2018-07-19 MED ORDER — MIDAZOLAM HCL 2 MG/2ML IJ SOLN
INTRAMUSCULAR | Status: AC
  Filled 2018-07-19: qty 2

## 2018-07-19 MED ORDER — ISOPROPYL ALCOHOL 70 % SOLN
Status: DC | PRN
  Administered 2018-07-19: 1 via TOPICAL

## 2018-07-19 MED ORDER — SODIUM CHLORIDE (PF) 0.9 % IJ SOLN
INTRAMUSCULAR | Status: DC | PRN
  Administered 2018-07-19: 30 mL

## 2018-07-19 SURGICAL SUPPLY — 55 items
BAG DECANTER FOR FLEXI CONT (MISCELLANEOUS) IMPLANT
BAG ZIPLOCK 12X15 (MISCELLANEOUS) IMPLANT
BLADE SURG SZ10 CARB STEEL (BLADE) ×6 IMPLANT
CHLORAPREP W/TINT 26ML (MISCELLANEOUS) ×3 IMPLANT
CLOTH BEACON ORANGE TIMEOUT ST (SAFETY) ×3 IMPLANT
COVER PERINEAL POST (MISCELLANEOUS) ×3 IMPLANT
COVER SURGICAL LIGHT HANDLE (MISCELLANEOUS) ×3 IMPLANT
COVER WAND RF STERILE (DRAPES) ×3 IMPLANT
CUP SECTOR GRIPTON 58MM (Orthopedic Implant) ×3 IMPLANT
DECANTER SPIKE VIAL GLASS SM (MISCELLANEOUS) ×3 IMPLANT
DERMABOND ADVANCED (GAUZE/BANDAGES/DRESSINGS) ×2
DERMABOND ADVANCED .7 DNX12 (GAUZE/BANDAGES/DRESSINGS) ×1 IMPLANT
DRAPE IMP U-DRAPE 54X76 (DRAPES) ×3 IMPLANT
DRAPE SHEET LG 3/4 BI-LAMINATE (DRAPES) ×9 IMPLANT
DRAPE STERI IOBAN 125X83 (DRAPES) ×3 IMPLANT
DRAPE U-SHAPE 47X51 STRL (DRAPES) ×6 IMPLANT
DRESSING AQUACEL AG SP 3.5X10 (GAUZE/BANDAGES/DRESSINGS) ×1 IMPLANT
DRSG AQUACEL AG ADV 3.5X10 (GAUZE/BANDAGES/DRESSINGS) ×3 IMPLANT
DRSG AQUACEL AG SP 3.5X10 (GAUZE/BANDAGES/DRESSINGS) ×3
ELECT PENCIL ROCKER SW 15FT (MISCELLANEOUS) ×3 IMPLANT
ELECT REM PT RETURN 15FT ADLT (MISCELLANEOUS) ×3 IMPLANT
GAUZE SPONGE 4X4 12PLY STRL (GAUZE/BANDAGES/DRESSINGS) ×3 IMPLANT
GLOVE BIO SURGEON STRL SZ8.5 (GLOVE) ×6 IMPLANT
GLOVE BIOGEL PI IND STRL 8.5 (GLOVE) ×1 IMPLANT
GLOVE BIOGEL PI INDICATOR 8.5 (GLOVE) ×2
GOWN SPEC L3 XXLG W/TWL (GOWN DISPOSABLE) ×3 IMPLANT
HANDPIECE INTERPULSE COAX TIP (DISPOSABLE) ×2
HEAD M SROM 36MM 2 (Hips) IMPLANT
HEAD M SROM 36MM PLUS 1.5 (Hips) ×1 IMPLANT
HOLDER FOLEY CATH W/STRAP (MISCELLANEOUS) ×3 IMPLANT
HOOD PEEL AWAY FLYTE STAYCOOL (MISCELLANEOUS) ×12 IMPLANT
LINER NEUTRAL 52X36X58N (Liner) ×3 IMPLANT
MARKER SKIN DUAL TIP RULER LAB (MISCELLANEOUS) ×3 IMPLANT
NEEDLE SPNL 18GX3.5 QUINCKE PK (NEEDLE) ×3 IMPLANT
PACK ANTERIOR HIP CUSTOM (KITS) ×3 IMPLANT
SAW OSC TIP CART 19.5X105X1.3 (SAW) ×3 IMPLANT
SEALER BIPOLAR AQUA 6.0 (INSTRUMENTS) ×3 IMPLANT
SET HNDPC FAN SPRY TIP SCT (DISPOSABLE) ×1 IMPLANT
SROM M HEAD 36MM 2 (Hips) IMPLANT
SROM M HEAD 36MM PLUS 1.5 (Hips) ×3 IMPLANT
STEM TRI LOC GRIPTION SZ 9 STD ×1 IMPLANT
SUT ETHIBOND NAB CT1 #1 30IN (SUTURE) ×6 IMPLANT
SUT MNCRL AB 3-0 PS2 18 (SUTURE) ×3 IMPLANT
SUT MNCRL AB 4-0 PS2 18 (SUTURE) ×3 IMPLANT
SUT MON AB 2-0 CT1 36 (SUTURE) ×6 IMPLANT
SUT STRATAFIX PDO 1 14 VIOLET (SUTURE) ×2
SUT STRATFX PDO 1 14 VIOLET (SUTURE) ×1
SUT VIC AB 2-0 CT1 27 (SUTURE) ×2
SUT VIC AB 2-0 CT1 TAPERPNT 27 (SUTURE) ×1 IMPLANT
SUTURE STRATFX PDO 1 14 VIOLET (SUTURE) ×1 IMPLANT
SYR 50ML LL SCALE MARK (SYRINGE) ×3 IMPLANT
TRAY FOLEY MTR SLVR 16FR STAT (SET/KITS/TRAYS/PACK) ×3 IMPLANT
TRI LOC GRIPTION SZ 9 STD ×3 IMPLANT
WATER STERILE IRR 1000ML POUR (IV SOLUTION) ×3 IMPLANT
YANKAUER SUCT BULB TIP 10FT TU (MISCELLANEOUS) ×3 IMPLANT

## 2018-07-19 NOTE — Transfer of Care (Signed)
Immediate Anesthesia Transfer of Care Note  Patient: Dominic Rogers  Procedure(s) Performed: TOTAL HIP ARTHROPLASTY ANTERIOR APPROACH (Right Hip)  Patient Location: PACU  Anesthesia Type:General  Level of Consciousness: awake, alert  and oriented  Airway & Oxygen Therapy: Patient Spontanous Breathing and Patient connected to face mask oxygen  Post-op Assessment: Report given to RN and Post -op Vital signs reviewed and stable  Post vital signs: Reviewed and stable  Last Vitals:  Vitals Value Taken Time  BP 161/83 07/19/2018  1:54 PM  Temp    Pulse 72 07/19/2018  1:57 PM  Resp 13 07/19/2018  1:57 PM  SpO2 97 % 07/19/2018  1:57 PM  Vitals shown include unvalidated device data.  Last Pain:  Vitals:   07/19/18 0910  PainSc: 0-No pain         Complications: No apparent anesthesia complications

## 2018-07-19 NOTE — Anesthesia Postprocedure Evaluation (Addendum)
Anesthesia Post Note  Patient: Dominic Rogers  Procedure(s) Performed: TOTAL HIP ARTHROPLASTY ANTERIOR APPROACH (Right Hip)     Patient location during evaluation: PACU Anesthesia Type: Regional and General Level of consciousness: oriented and awake and alert Pain management: pain level controlled Vital Signs Assessment: post-procedure vital signs reviewed and stable Respiratory status: spontaneous breathing, respiratory function stable and patient connected to nasal cannula oxygen Cardiovascular status: blood pressure returned to baseline and stable Postop Assessment: no apparent nausea or vomiting Anesthetic complications: no    Last Vitals:  Vitals:   07/19/18 1500 07/19/18 1534  BP: 136/75 134/77  Pulse: 62 61  Resp: 12 15  Temp: 36.6 C 36.4 C  SpO2: 97% 97%    Last Pain:  Vitals:   07/19/18 1537  TempSrc: Oral  PainSc:                  Justeen Hehr L Sulayman Manning

## 2018-07-19 NOTE — Evaluation (Signed)
Physical Therapy Evaluation Patient Details Name: Dominic Rogers MRN: [id number] DOB: 02/23/1941 Today's Date: 07/19/2018   History of Present Illness  77 yo male s/p R DA-THA on 07/19/18. PMH includes OA, aortic stenosis, HLD, history of vtach, HTN, HLD, LAE, mitral valve regurgitation, OSA, CABG x4, R knee scope, lumbar surgery, R RTC repair 2005.  Clinical Impression  Pt presents with R hip pain, R hip weakness post-surgically in combination with LE generalized weakness, increased time and effort to perform mobility tasks, and decreased tolerance for activity due to hip pain. Pt to benefit from acute PT to address deficits. Pt ambulated 75 ft with min guard assist for safety, with trendelenburg gait and required frequent cues for safe placement in RW. Pt educated on quad sets (5-10/hour), ankle pumps (20/hour), and heel slides (5-10/hour) to perform this evening to lessen stiffness and increase circulation, to pt's tolerance and limited by pain. PT recommending home with OPPT due to gait deficits and generalized LE weakness. Pt also expresses interest in getting OPPT. PT to progress mobility as tolerated, and will continue to follow acutely.      Follow Up Recommendations Follow surgeon's recommendation for DC plan and follow-up therapies;Supervision for mobility/OOB(HEP, versus OPPT due to pt expressing interest due longstanding hip disability and LE weakness)    Equipment Recommendations  Rolling walker with 5" wheels    Recommendations for Other Services       Precautions / Restrictions Precautions Precautions: Fall Restrictions Weight Bearing Restrictions: No Other Position/Activity Restrictions: WBAT       Mobility  Bed Mobility Overal bed mobility: Needs Assistance Bed Mobility: Supine to Sit     Supine to sit: Min guard;HOB elevated     General bed mobility comments: Min guard for safety. Pt with increased time and effort to perform, PT offered assist but pt  deferred.   Transfers Overall transfer level: Needs assistance Equipment used: Rolling walker (2 wheeled) Transfers: Sit to/from Stand Sit to Stand: Min guard;From elevated surface         General transfer comment: Min guard for safety, verbal cuing for standing mostly with LLE and UEs to offweight RLE.   Ambulation/Gait Ambulation/Gait assistance: Min guard;+2 safety/equipment(daughter assisted with IV pole ) Gait Distance (Feet): 75 Feet Assistive device: Rolling walker (2 wheeled) Gait Pattern/deviations: Step-to pattern;Trunk flexed;Decreased stance time - right;Decreased weight shift to right Gait velocity: decr    General Gait Details: Min guard for safety. Frequent verbal cuing for placement in RW and posture. Cuing also for sequencing and turning. Pt with trendelenburg gait when weightbearing with RLE.  Stairs            Wheelchair Mobility    Modified Rankin (Stroke Patients Only)       Balance Overall balance assessment: Mild deficits observed, not formally tested                                           Pertinent Vitals/Pain Pain Assessment: 0-10 Pain Score: 4  Pain Location: R hip  Pain Descriptors / Indicators: Sore;Aching Pain Intervention(s): Limited activity within patient's tolerance;Repositioned;Ice applied;Monitored during session    Home Living Family/patient expects to be discharged to:: Private residence Living Arrangements: Spouse/significant other Available Help at Discharge: Family;Available PRN/intermittently Type of Home: House(going to daughter's for the first few days, then home with wife ) Home Access: Stairs to enter Entrance Stairs-Rails: None Entrance  Stairs-Number of Steps: 2 Home Layout: Two level;Able to live on main level with bedroom/bathroom Home Equipment: Walker - 4 wheels;Cane - single point;Crutches      Prior Function Level of Independence: Independent with assistive device(s)          Comments: used crutches for longer distances prior to surgery. Cane and crutches both at pt's home, not at daughter's      Hand Dominance   Dominant Hand: Right    Extremity/Trunk Assessment   Upper Extremity Assessment Upper Extremity Assessment: Overall WFL for tasks assessed    Lower Extremity Assessment Lower Extremity Assessment: Generalized weakness;RLE deficits/detail RLE Deficits / Details: suspected post-surgical weakness of R hip; able to perform quad set and heel slides, ankle pumps.  RLE Sensation: WNL    Cervical / Trunk Assessment Cervical / Trunk Assessment: Normal  Communication   Communication: No difficulties  Cognition Arousal/Alertness: Awake/alert Behavior During Therapy: WFL for tasks assessed/performed Overall Cognitive Status: Within Functional Limits for tasks assessed                                        General Comments      Exercises     Assessment/Plan    PT Assessment Patient needs continued PT services  PT Problem List Decreased strength;Pain;Decreased range of motion;Decreased activity tolerance;Decreased balance;Decreased knowledge of use of DME;Decreased safety awareness;Decreased mobility       PT Treatment Interventions DME instruction;Therapeutic activities;Gait training;Patient/family education;Therapeutic exercise;Balance training;Stair training;Functional mobility training    PT Goals (Current goals can be found in the Care Plan section)  Acute Rehab PT Goals Patient Stated Goal: none stated  PT Goal Formulation: With patient Time For Goal Achievement: 07/26/18 Potential to Achieve Goals: Good    Frequency 7X/week   Barriers to discharge        Co-evaluation               AM-PAC PT "6 Clicks" Mobility  Outcome Measure Help needed turning from your back to your side while in a flat bed without using bedrails?: A Little Help needed moving from lying on your back to sitting on the side of a  flat bed without using bedrails?: A Little Help needed moving to and from a bed to a chair (including a wheelchair)?: A Little Help needed standing up from a chair using your arms (e.g., wheelchair or bedside chair)?: A Little Help needed to walk in hospital room?: A Little Help needed climbing 3-5 steps with a railing? : A Little 6 Click Score: 18    End of Session Equipment Utilized During Treatment: Gait belt;Oxygen(O2 reapplied after session, SpO2 down to 86% on room air. ) Activity Tolerance: Patient tolerated treatment well Patient left: in chair;with chair alarm set;with call bell/phone within reach;with family/visitor present;with SCD's reapplied Nurse Communication: Mobility status PT Visit Diagnosis: Other abnormalities of gait and mobility (R26.89);Difficulty in walking, not elsewhere classified (R26.2)    Time: [id number] PT Time Calculation (min) (ACUTE ONLY): 35 min   Charges:   PT Evaluation $PT Eval Low Complexity: 1 Low PT Treatments $Gait Training: 8-22 mins       Nicola PoliceAlexa D Chioma Mukherjee, PT Acute Rehabilitation Services Pager [phone]  Office [phone]   Tyrone AppleAlexa D Despina Hiddenure 07/19/2018, 6:54 PM

## 2018-07-19 NOTE — Interval H&P Note (Signed)
History and Physical Interval Note:  07/19/2018 9:36 AM  Dominic Rogers  has presented today for surgery, with the diagnosis of Degenerative joint disease right hip  The various methods of treatment have been discussed with the patient and family. After consideration of risks, benefits and other options for treatment, the patient has consented to  Procedure(s): TOTAL HIP ARTHROPLASTY ANTERIOR APPROACH (Right) as a surgical intervention .  The patient's history has been reviewed, patient examined, no change in status, stable for surgery.  I have reviewed the patient's chart and labs.  Questions were answered to the patient's satisfaction.     Iline OvenBrian J Deshanda Molitor

## 2018-07-19 NOTE — Anesthesia Procedure Notes (Signed)
Procedure Name: Intubation Date/Time: 07/19/2018 11:18 AM Performed by: Edwina Grossberg D, CRNA Pre-anesthesia Checklist: Patient identified, Emergency Drugs available, Suction available and Patient being monitored Patient Re-evaluated:Patient Re-evaluated prior to induction Oxygen Delivery Method: Circle system utilized Preoxygenation: Pre-oxygenation with 100% oxygen Induction Type: IV induction Ventilation: Mask ventilation without difficulty Laryngoscope Size: Mac and 4 Grade View: Grade I Tube type: Oral Tube size: 7.5 mm Number of attempts: 1 Airway Equipment and Method: Stylet Placement Confirmation: ETT inserted through vocal cords under direct vision,  positive ETCO2 and breath sounds checked- equal and bilateral Secured at: 22 cm Tube secured with: Tape Dental Injury: Teeth and Oropharynx as per pre-operative assessment

## 2018-07-19 NOTE — Op Note (Signed)
OPERATIVE REPORT  SURGEON: Samson Frederic, MD   ASSISTANT: Skip Mayer, PA-C.  PREOPERATIVE DIAGNOSIS: Right hip arthritis.   POSTOPERATIVE DIAGNOSIS: Right hip arthritis.   PROCEDURE: Right total hip arthroplasty, anterior approach.   IMPLANTS: DePuy Tri Lock stem, size 9, std offset. DePuy Pinnacle Cup, size 58 mm. DePuy Altrx liner, size 36 by 58 mm, neutral. DePuy metal head ball, size 36 + 1.5 mm.  ANESTHESIA:  General  ESTIMATED BLOOD LOSS:-350 mL    ANTIBIOTICS: 1 g vancomycin (positive MRSA screen).  DRAINS: None.  COMPLICATIONS: None.   CONDITION: PACU - hemodynamically stable.   BRIEF CLINICAL NOTE: Dominic Rogers is a 77 y.o. male with a long-standing history of Right hip arthritis. After failing conservative management, the patient was indicated for total hip arthroplasty. The risks, benefits, and alternatives to the procedure were explained, and the patient elected to proceed.  PROCEDURE IN DETAIL: Surgical site was marked by myself in the pre-op holding area. Once inside the operating room, spinal anesthesia was attempted unsuccessfully. General anesthesia was induced, and a foley catheter was inserted. The patient was then positioned on the Hana table. All bony prominences were well padded. The hip was prepped and draped in the normal sterile surgical fashion. A time-out was called verifying side and site of surgery. The patient received IV antibiotics within 60 minutes of beginning the procedure.  The direct anterior approach to the hip was performed through the Hueter interval. Lateral femoral circumflex vessels were treated with the Auqumantys. The anterior capsule was exposed and an inverted T capsulotomy was made.The femoral neck cut was made to the level of the templated cut. A corkscrew was placed into the head and the head was removed. The femoral head was found to have eburnated bone. The head was passed to the back table and was  measured.  Acetabular exposure was achieved, and the pulvinar and labrum were excised. Sequential reaming of the acetabulum was then performed up to a size 57 mm reamer. A 58 mm cup was then opened and impacted into place at approximately 40 degrees of abduction and 20 degrees of anteversion. The final polyethylene liner was impacted into place and acetabular osteophytes were removed.   I then gained femoral exposure taking care to protect the abductors and greater trochanter. This was performed using standard external rotation, extension, and adduction. The capsule was peeled off the inner aspect of the greater trochanter, taking care to preserve the short external rotators. A cookie cutter was used to enter the femoral canal, and then the femoral canal finder was placed. Sequential broaching was performed up to a size 9. Calcar planer was used on the femoral neck remnant. I placed a hi offset neck, and due to the offset, I was unable to reduced the hip. Then I placed a std neck and a trial head ball. The hip was reduced. Leg lengths and offset were checked fluoroscopically. The hip was dislocated and trial components were removed. The final implants were placed, and the hip was reduced.  Fluoroscopy was used to confirm component position and leg lengths. At 90 degrees of external rotation and full extension, the hip was stable to an anterior directed force.  The wound was copiously irrigated with normal saline using pulse lavage. Marcaine solution was injected into the periarticular soft tissue. The wound was closed in layers using #1 Vicryl and V-Loc for the fascia, 2-0 Vicryl for the subcutaneous fat, 2-0 Monocryl for the deep dermal layer, 3-0 running Monocryl subcuticular stitch,  and Dermabond for the skin. Once the glue was fully dried, an Aquacell Ag dressing was applied. The patient was transported to the recovery room in stable condition. Sponge, needle, and instrument counts were  correct at the end of the case x2. The patient tolerated the procedure well and there were no known complications.  Please note that a surgical assistant was a medical necessity for this procedure to perform it in a safe and expeditious manner. Assistant was necessary to provide appropriate retraction of vital neurovascular structures, to prevent femoral fracture, and to allow for anatomic placement of the prosthesis.

## 2018-07-19 NOTE — Anesthesia Preprocedure Evaluation (Addendum)
Anesthesia Evaluation  Patient identified by MRN, date of birth, ID band Patient awake    Reviewed: Allergy & Precautions, NPO status , Patient's Chart, lab work & pertinent test results  History of Anesthesia Complications (+) PONV and history of anesthetic complications  Airway Mallampati: II  TM Distance: >3 FB Neck ROM: Full    Dental no notable dental hx. (+) Teeth Intact, Dental Advisory Given   Pulmonary sleep apnea and Continuous Positive Airway Pressure Ventilation ,    Pulmonary exam normal breath sounds clear to auscultation       Cardiovascular hypertension, + CAD and + CABG (4v CABG 2012)  Normal cardiovascular exam Rhythm:Regular Rate:Normal     Neuro/Psych negative neurological ROS  negative psych ROS   GI/Hepatic negative GI ROS, Neg liver ROS,   Endo/Other  Hypothyroidism   Renal/GU negative Renal ROS  negative genitourinary   Musculoskeletal  (+) Arthritis ,   Abdominal   Peds  Hematology  (+) Blood dyscrasia, anemia ,   Anesthesia Other Findings   Reproductive/Obstetrics                            Anesthesia Physical Anesthesia Plan  ASA: III  Anesthesia Plan: Spinal and General   Post-op Pain Management:  Regional for Post-op pain   Induction: Intravenous  PONV Risk Score and Plan: 3 and Treatment may vary due to age or medical condition, Ondansetron and Dexamethasone  Airway Management Planned: Oral ETT  Additional Equipment:   Intra-op Plan:   Post-operative Plan: Extubation in OR  Informed Consent: I have reviewed the patients History and Physical, chart, labs and discussed the procedure including the risks, benefits and alternatives for the proposed anesthesia with the patient or authorized representative who has indicated his/her understanding and acceptance.   Dental advisory given  Plan Discussed with: CRNA  Anesthesia Plan Comments:         Anesthesia Quick Evaluation

## 2018-07-19 NOTE — Discharge Instructions (Signed)
°Dr. Carlen Fils °Joint Replacement Specialist °[facility] °[address] °[place], [place] [address] °[phone] ° ° °TOTAL HIP REPLACEMENT POSTOPERATIVE DIRECTIONS ° ° ° °Hip Rehabilitation, Guidelines Following Surgery  ° °WEIGHT BEARING °Weight bearing as tolerated with assist device (walker, cane, etc) as directed, use it as long as suggested by your surgeon or therapist, typically at least 4-6 weeks. ° °The results of a hip operation are greatly improved after range of motion and muscle strengthening exercises. Follow all safety measures which are given to protect your hip. If any of these exercises cause increased pain or swelling in your joint, decrease the amount until you are comfortable again. Then slowly increase the exercises. Call your caregiver if you have problems or questions.  ° °HOME CARE INSTRUCTIONS  °Most of the following instructions are designed to prevent the dislocation of your new hip.  °Remove items at home which could result in a fall. This includes throw rugs or furniture in walking pathways.  °Continue medications as instructed at time of discharge. °· You may have some home medications which will be placed on hold until you complete the course of blood thinner medication. °· You may start showering once you are discharged home. Do not remove your dressing. °Do not put on socks or shoes without following the instructions of your caregivers.   °Sit on chairs with arms. Use the chair arms to help push yourself up when arising.  °Arrange for the use of a toilet seat elevator so you are not sitting low.  °· Walk with walker as instructed.  °You may resume a sexual relationship in one month or when given the OK by your caregiver.  °Use walker as long as suggested by your caregivers.  °You may put full weight on your legs and walk as much as is comfortable. °Avoid periods of inactivity such as sitting longer than an hour when not asleep. This helps prevent  blood clots.  °You may return to work once you are cleared by your surgeon.  °Do not drive a car for 6 weeks or until released by your surgeon.  °Do not drive while taking narcotics.  °Wear elastic stockings for two weeks following surgery during the day but you may remove then at night.  °Make sure you keep all of your appointments after your operation with all of your doctors and caregivers. You should call the office at the above phone number and make an appointment for approximately two weeks after the date of your surgery. °Please pick up a stool softener and laxative for home use as long as you are requiring pain medications. °· ICE to the affected hip every three hours for 30 minutes at a time and then as needed for pain and swelling. Continue to use ice on the hip for pain and swelling from surgery. You may notice swelling that will progress down to the foot and ankle.  This is normal after surgery.  Elevate the leg when you are not up walking on it.   °It is important for you to complete the blood thinner medication as prescribed by your doctor. °· Continue to use the breathing machine which will help keep your temperature down.  It is common for your temperature to cycle up and down following surgery, especially at night when you are not up moving around and exerting yourself.  The breathing machine keeps your lungs expanded and your temperature down. ° °RANGE OF MOTION AND STRENGTHENING EXERCISES  °These exercises are   designed to help you keep full movement of your hip joint. Follow your caregiver's or physical therapist's instructions. Perform all exercises about fifteen times, three times per day or as directed. Exercise both hips, even if you have had only one joint replacement. These exercises can be done on a training (exercise) mat, on the floor, on a table or on a bed. Use whatever works the best and is most comfortable for you. Use music or television while you are exercising so that the exercises  are a pleasant break in your day. This will make your life better with the exercises acting as a break in routine you can look forward to.  °Lying on your back, slowly slide your foot toward your buttocks, raising your knee up off the floor. Then slowly slide your foot back down until your leg is straight again.  °Lying on your back spread your legs as far apart as you can without causing discomfort.  °Lying on your side, raise your upper leg and foot straight up from the floor as far as is comfortable. Slowly lower the leg and repeat.  °Lying on your back, tighten up the muscle in the front of your thigh (quadriceps muscles). You can do this by keeping your leg straight and trying to raise your heel off the floor. This helps strengthen the largest muscle supporting your knee.  °Lying on your back, tighten up the muscles of your buttocks both with the legs straight and with the knee bent at a comfortable angle while keeping your heel on the floor.  ° °SKILLED REHAB INSTRUCTIONS: °If the patient is transferred to a skilled rehab facility following release from the hospital, a list of the current medications will be sent to the facility for the patient to continue.  When discharged from the skilled rehab facility, please have the facility set up the patient's Home Health Physical Therapy prior to being released. Also, the skilled facility will be responsible for providing the patient with their medications at time of release from the facility to include their pain medication and their blood thinner medication. If the patient is still at the rehab facility at time of the two week follow up appointment, the skilled rehab facility will also need to assist the patient in arranging follow up appointment in our office and any transportation needs. ° °MAKE SURE YOU:  °Understand these instructions.  °Will watch your condition.  °Will get help right away if you are not doing well or get worse. ° °Pick up stool softner and  laxative for home use following surgery while on pain medications. °Do not remove your dressing. °The dressing is waterproof--it is OK to take showers. °Continue to use ice for pain and swelling after surgery. °Do not use any lotions or creams on the incision until instructed by your surgeon. °Total Hip Protocol. ° ° °

## 2018-07-20 ENCOUNTER — Encounter (HOSPITAL_COMMUNITY): Payer: Self-pay | Admitting: Orthopedic Surgery

## 2018-07-20 LAB — BASIC METABOLIC PANEL
ANION GAP: 8 (ref 5–15)
BUN: 28 mg/dL — ABNORMAL HIGH (ref 8–23)
CO2: 22 mmol/L (ref 22–32)
Calcium: 8.8 mg/dL — ABNORMAL LOW (ref 8.9–10.3)
Chloride: 109 mmol/L (ref 98–111)
Creatinine, Ser: 1.15 mg/dL (ref 0.61–1.24)
GFR calc Af Amer: >60 mL/min (ref >60)
GFR calc non Af Amer: >60 mL/min (ref >60)
Glucose, Bld: 163 mg/dL — ABNORMAL HIGH (ref 70–99)
Potassium: 4.4 mmol/L (ref 3.5–5.1)
Sodium: 139 mmol/L (ref 135–145)

## 2018-07-20 LAB — CBC
HCT: 31.5 % — ABNORMAL LOW (ref 39.0–52.0)
Hemoglobin: 10.2 g/dL — ABNORMAL LOW (ref 13.0–17.0)
MCH: 32.5 pg (ref 26.0–34.0)
MCHC: 32.4 g/dL (ref 30.0–36.0)
MCV: 100.3 fL — ABNORMAL HIGH (ref 80.0–100.0)
NRBC: 0 % (ref 0.0–0.2)
Platelets: 206 10*3/uL (ref 150–400)
RBC: 3.14 MIL/uL — ABNORMAL LOW (ref 4.22–5.81)
RDW: 12.1 % (ref 11.5–15.5)
WBC: 13.7 10*3/uL — ABNORMAL HIGH (ref 4.0–10.5)

## 2018-07-20 MED ORDER — SENNA 8.6 MG PO TABS: 1.0000 | ORAL_TABLET | Freq: Two times a day (BID) | ORAL | 0 refills | Status: AC

## 2018-07-20 MED ORDER — ONDANSETRON HCL 4 MG PO TABS: 4.0000 mg | ORAL_TABLET | Freq: Four times a day (QID) | ORAL | 0 refills | Status: AC | PRN

## 2018-07-20 MED ORDER — ASPIRIN 81 MG PO CHEW: 81.0000 mg | CHEWABLE_TABLET | Freq: Two times a day (BID) | ORAL | 1 refills | Status: AC

## 2018-07-20 MED ORDER — HYDROCODONE-ACETAMINOPHEN 5-325 MG PO TABS: 1.0000 | ORAL_TABLET | Freq: Four times a day (QID) | ORAL | 0 refills | Status: AC | PRN

## 2018-07-20 MED ORDER — DOCUSATE SODIUM 100 MG PO CAPS: 100.0000 mg | ORAL_CAPSULE | Freq: Two times a day (BID) | ORAL | 1 refills | Status: AC

## 2018-07-20 NOTE — Progress Notes (Signed)
Pt and pt's family were provided with d/c instructions and prescriptions. After discussing the pt's plan of care upon d/c home, the pt and pt's family reported no further questions or concerns.

## 2018-07-20 NOTE — Progress Notes (Signed)
Physical Therapy Treatment Patient Details Name: Dominic RuddyWilliam E Mayfield MRN: [id number] DOB: 01/28/1941 Today's Date: 07/20/2018    History of Present Illness 77 yo male s/p R DA-THA on 07/19/18. PMH includes OA, aortic stenosis, HLD, history of vtach, HTN, HLD, LAE, mitral valve regurgitation, OSA, CABG x4, R knee scope, lumbar surgery, R RTC repair 2005.    PT Comments    POD # 1  Assisted with amb, stairs and HEP.    Follow Up Recommendations  Follow surgeon's recommendation for DC plan and follow-up therapies;Supervision for mobility/OOB     Equipment Recommendations  Rolling walker with 5" wheels    Recommendations for Other Services       Precautions / Restrictions Precautions Precautions: Fall Restrictions Weight Bearing Restrictions: No Other Position/Activity Restrictions: WBAT     Mobility  Bed Mobility Overal bed mobility: Needs Assistance Bed Mobility: Sit to Supine       Sit to supine: Min guard   General bed mobility comments: demonstarted and educated how to use a belt to self assist   Transfers Overall transfer level: Needs assistance Equipment used: Rolling walker (2 wheeled) Transfers: Sit to/from Stand Sit to Stand: Supervision;Min guard         General transfer comment: 25% VC's on safety   Ambulation/Gait Ambulation/Gait assistance: Min guard   Assistive device: Rolling walker (2 wheeled) Gait Pattern/deviations: Step-to pattern;Trunk flexed;Decreased stance time - right;Decreased weight shift to right Gait velocity: decr    General Gait Details: Min guard for safety. Frequent verbal cuing for placement in RW and posture. Cuing also for sequencing and turning.  Impulsive   Stairs Stairs: Yes Stairs assistance: Min guard;Min assist Stair Management: One rail Left;Forwards Number of Stairs: 2 General stair comments: 50% VC's on proper tech, step to, safety   Wheelchair Mobility    Modified Rankin (Stroke Patients Only)        Balance                                            Cognition Arousal/Alertness: Awake/alert Behavior During Therapy: WFL for tasks assessed/performed Overall Cognitive Status: Within Functional Limits for tasks assessed                                 General Comments: quick moving      Exercises   Total Hip Replacement TE's 10 reps ankle pumps 10 reps knee presses 10 reps heel slides 10 reps SAQ's 10 reps ABD Followed by ICE     General Comments        Pertinent Vitals/Pain Pain Assessment: 0-10 Pain Score: 3  Pain Location: R hip  Pain Descriptors / Indicators: Sore;Aching Pain Intervention(s): Monitored during session;Ice applied    Home Living                      Prior Function            PT Goals (current goals can now be found in the care plan section) Progress towards PT goals: Progressing toward goals    Frequency    7X/week      PT Plan Current plan remains appropriate    Co-evaluation              AM-PAC PT "6 Clicks" Mobility   Outcome Measure  Help needed turning from your back to your side while in a flat bed without using bedrails?: A Little Help needed moving from lying on your back to sitting on the side of a flat bed without using bedrails?: A Little Help needed moving to and from a bed to a chair (including a wheelchair)?: A Little Help needed standing up from a chair using your arms (e.g., wheelchair or bedside chair)?: A Little Help needed to walk in hospital room?: A Little Help needed climbing 3-5 steps with a railing? : A Little 6 Click Score: 18    End of Session Equipment Utilized During Treatment: Gait belt Activity Tolerance: Patient tolerated treatment well Patient left: in chair;with chair alarm set;with call bell/phone within reach;with family/visitor present;with SCD's reapplied Nurse Communication: Mobility status PT Visit Diagnosis: Other abnormalities of gait and  mobility (R26.89);Difficulty in walking, not elsewhere classified (R26.2)     Time: 1105-1130 PT Time Calculation (min) (ACUTE ONLY): 25 min  Charges:  $Gait Training: 8-22 mins $Therapeutic Exercise: 8-22 mins                     Felecia Shelling  PTA Acute  Rehabilitation Services Pager      [phone] Office      [phone]

## 2018-07-20 NOTE — Discharge Summary (Signed)
Physician Discharge Summary  Patient ID: Dominic Rogers MRN: [id number] DOB/AGE: [birth year] 77 y.o.  Admit date: 07/19/2018 Discharge date: 07/20/2018  Admission Diagnoses:  Osteoarthritis of right hip  Discharge Diagnoses:  Principal Problem:   Osteoarthritis of right hip   Past Medical History:  Diagnosis Date  . Aortic stenosis    Very Mild  . Arthritis    knees, shoulders, hands  . Ascending aorta dilatation (HCC) 06/08/2018   Mild, 3.9CM, noted on ECHO  . At risk for sleep apnea    STOP-BANG=  6   SENT TO PCP DR Lorie Apley  IN [place], [place]  . BPH (benign prostatic hypertrophy)   . Coronary artery disease    CARDIOLOGIST--  DR Serena Croissant  WITH [facility]  . First degree AV block 10/29/2015   Noted on EKG  . History of ventricular tachycardia   . Hyperlipidemia   . Hypertension   . Hypothyroidism   . Iron deficiency anemia   . LAE (left atrial enlargement) 06/08/2018   Mild, noted on ECHO  . LVH (left ventricular hypertrophy) 06/08/2018   Moderate, noted on ECHO  . Medial meniscus tear 2015   Right  . MR (mitral regurgitation) 06/08/2018   Mild, noted on ECHO  . OSA on CPAP   . PONV (postoperative nausea and vomiting)   . Right knee DJD 2015  . Right leg weakness   . S/P CABG x 4    2012  . Wears glasses     Surgeries: Procedure(s): TOTAL HIP ARTHROPLASTY ANTERIOR APPROACH on 07/19/2018   Consultants (if any):   Discharged Condition: Improved  Hospital Course: Dominic Rogers is an 77 y.o. male who was admitted 07/19/2018 with a diagnosis of Osteoarthritis of right hip and went to the operating room on 07/19/2018 and underwent the above named procedures.    He was given perioperative antibiotics:  Anti-infectives (From admission, onward)   Start     Dose/Rate Route Frequency Ordered Stop   07/19/18 2230  vancomycin (VANCOCIN) IVPB 1000 mg/200 mL premix     1,000 mg 200 mL/hr over 60 Minutes Intravenous Every 12 hours  07/19/18 1529 07/19/18 2330   07/19/18 0900  vancomycin (VANCOCIN) IVPB 1000 mg/200 mL premix     1,000 mg 200 mL/hr over 60 Minutes Intravenous On call to O.R. 07/19/18 0848 07/19/18 1135   07/19/18 0900  ceFAZolin (ANCEF) IVPB 2g/100 mL premix     2 g 200 mL/hr over 30 Minutes Intravenous On call to O.R. 07/19/18 0848 07/19/18 1150    .  He was given sequential compression devices, early ambulation, and ASA for DVT prophylaxis.  He benefited maximally from the hospital stay and there were no complications.    Recent vital signs:  Vitals:   07/20/18 0521 07/20/18 0811  BP: 109/61 (!) 98/54  Pulse: 67 68  Resp: 16   Temp: 98.2 F (36.8 C)   SpO2: 98%     Recent laboratory studies:  Lab Results  Component Value Date   HGB 10.2 (L) 07/20/2018   HGB 12.1 (L) 07/12/2018   HGB 13.9 11/15/2013   Lab Results  Component Value Date   WBC 13.7 (H) 07/20/2018   PLT 206 07/20/2018   No results found for: INR Lab Results  Component Value Date   NA 139 07/20/2018   K 4.4 07/20/2018   CL 109 07/20/2018   CO2 22 07/20/2018   BUN 28 (H) 07/20/2018   CREATININE 1.15 07/20/2018  GLUCOSE 163 (H) 07/20/2018    Discharge Medications:   Allergies as of 07/20/2018      Reactions   Morphine And Related Itching, Nausea And Vomiting, Other (See Comments)   SEVERE      Medication List    STOP taking these medications   aspirin EC 81 MG tablet Replaced by:  aspirin 81 MG chewable tablet     TAKE these medications   aspirin 81 MG chewable tablet Chew 1 tablet (81 mg total) by mouth 2 (two) times daily. Replaces:  aspirin EC 81 MG tablet   atorvastatin 20 MG tablet Commonly known as:  LIPITOR Take 20 mg by mouth daily.   docusate sodium 100 MG capsule Commonly known as:  COLACE Take 1 capsule (100 mg total) by mouth 2 (two) times daily.   fluticasone 50 MCG/ACT nasal spray Commonly known as:  FLONASE Place 1 spray into both nostrils daily as needed for allergies.    GLUCOSAMINE-CHONDROITIN PO Take 2 tablets by mouth daily.   hydrochlorothiazide 25 MG tablet Commonly known as:  HYDRODIURIL Take 25 mg by mouth every morning.   HYDROcodone-acetaminophen 5-325 MG tablet Commonly known as:  NORCO/VICODIN Take 1 tablet by mouth every 6 (six) hours as needed for moderate pain (pain score 4-6).   ibuprofen 200 MG tablet Commonly known as:  ADVIL,MOTRIN Take 600 mg by mouth every 6 (six) hours as needed for headache or moderate pain.   irbesartan 300 MG tablet Commonly known as:  AVAPRO Take 300 mg by mouth daily.   levothyroxine 125 MCG tablet Commonly known as:  SYNTHROID, LEVOTHROID Take 125 mcg by mouth daily before breakfast.   multivitamin tablet Take 1 tablet by mouth daily.   ondansetron 4 MG tablet Commonly known as:  ZOFRAN Take 1 tablet (4 mg total) by mouth every 6 (six) hours as needed for nausea.   senna 8.6 MG Tabs tablet Commonly known as:  SENOKOT Take 1 tablet (8.6 mg total) by mouth 2 (two) times daily.   Turmeric Curcumin 500 MG Caps Take 500 mg by mouth daily.            Durable Medical Equipment  (From admission, onward)         Start     Ordered   07/20/18 0923  For home use only DME Walker rolling  Once    Question:  Patient needs a walker to treat with the following condition  Answer:  History of orthopedic surgery   07/20/18 0923          Diagnostic Studies: Dg Pelvis Portable  Result Date: 07/19/2018 CLINICAL DATA:  Status post right hip replacement. EXAM: PORTABLE PELVIS 1-2 VIEWS COMPARISON:  Fluoroscopic images of same day. FINDINGS: The right femoral and acetabular components appear to be well situated. Expected postoperative changes are seen in the surrounding soft tissues. No fracture or dislocation is noted. IMPRESSION: Status post right total hip arthroplasty. Electronically Signed   By: Lupita RaiderJames  Green Jr, M.D.   On: 07/19/2018 14:46   Dg C-arm 1-60 Min-no Report  Result Date:  07/19/2018 Fluoroscopy was utilized by the requesting physician.  No radiographic interpretation.   Dg Hip Operative Unilat W Or W/o Pelvis Right  Result Date: 07/19/2018 CLINICAL DATA:  Total right hip replacement. EXAM: OPERATIVE RIGHT HIP (WITH PELVIS IF PERFORMED) 7 VIEWS TECHNIQUE: Fluoroscopic spot image(s) were submitted for interpretation post-operatively. COMPARISON:  No prior. FINDINGS: Total right hip replacement. Anatomic alignment. Hardware intact. No acute abnormality. 0 minutes 10  seconds fluoroscopy time. IMPRESSION: Total right hip replacement anatomic alignment. Electronically Signed   By: Maisie Fus  Register   On: 07/19/2018 13:26    Disposition: Discharge disposition: 01-Home or Self Care       Discharge Instructions    Call MD / Call 911   Complete by:  As directed    If you experience chest pain or shortness of breath, CALL 911 and be transported to the hospital emergency room.  If you develope a fever above 101 F, pus (white drainage) or increased drainage or redness at the wound, or calf pain, call your surgeon's office.   Constipation Prevention   Complete by:  As directed    Drink plenty of fluids.  Prune juice may be helpful.  You may use a stool softener, such as Colace (over the counter) 100 mg twice a day.  Use MiraLax (over the counter) for constipation as needed.   Diet - low sodium heart healthy   Complete by:  As directed    Driving restrictions   Complete by:  As directed    No driving for 6 weeks   Increase activity slowly as tolerated   Complete by:  As directed    Lifting restrictions   Complete by:  As directed    No lifting for 6 weeks   TED hose   Complete by:  As directed    Use stockings (TED hose) for 2 weeks on both leg(s).  You may remove them at night for sleeping.      Follow-up Information    Samson Frederic, MD. Go on 08/03/2018.   Specialty:  Orthopedic Surgery Why:  You are scheduled for a post-operative appointment with Dr.  Linna Caprice on 08-03-18 at 2:15 pm Contact information: [address] [place] [address] [phone]            Signed: Iline Oven Laurinda Carreno 07/20/2018, 10:09 AM

## 2018-07-20 NOTE — Progress Notes (Signed)
    Subjective:  Patient reports pain as mild to moderate.  Denies N/V/CP/SOB. No c/o. Wants to go home.  Objective:   VITALS:   Vitals:   07/19/18 2205 07/20/18 0228 07/20/18 0521 07/20/18 0811  BP: 121/63 118/62 109/61 (!) 98/54  Pulse: 68 61 67 68  Resp: 18 17 16    Temp: 98.2 F (36.8 C) (!) 97.4 F (36.3 C) 98.2 F (36.8 C)   TempSrc: Oral Oral Oral   SpO2: 97% 98% 98%   Weight:      Height:        NAD ABD soft Sensation intact distally Intact pulses distally Dorsiflexion/Plantar flexion intact Incision: dressing C/D/I Compartment soft   Lab Results  Component Value Date   WBC 13.7 (H) 07/20/2018   HGB 10.2 (L) 07/20/2018   HCT 31.5 (L) 07/20/2018   MCV 100.3 (H) 07/20/2018   PLT 206 07/20/2018   BMET    Component Value Date/Time   NA 139 07/20/2018 0453   K 4.4 07/20/2018 0453   CL 109 07/20/2018 0453   CO2 22 07/20/2018 0453   GLUCOSE 163 (H) 07/20/2018 0453   BUN 28 (H) 07/20/2018 0453   CREATININE 1.15 07/20/2018 0453   CALCIUM 8.8 (L) 07/20/2018 0453   GFRNONAA >60 07/20/2018 0453   GFRAA >60 07/20/2018 0453     Assessment/Plan: 1 Day Post-Op   Principal Problem:   Osteoarthritis of right hip   WBAT with walker DVT ppx: Aspirin, SCDs, TEDS PO pain control PT/OT Dispo: D/C home with daughter; Anise SalvoHEP   Remiel Corti J Hibah Odonnell 07/20/2018, 10:06 AM   Samson FredericBrian Sherlene Rickel, MD Cell: [phone] [facility]  [place] [address], [place], [place] [address] Phone: [phone] [email or web address] Facebook  [employer]  LinkedIn  Twitter

## 2018-07-20 NOTE — Care Plan (Signed)
Ortho Bundle Case Management Note  Patient Details  Name: Dominic Rogers MRN: [id number] Date of Birth: 10/26/1940  R THA 07-19-18 DCP:  Home with dtr.  Lives in a 3 story home with 6 ste.   DME:  No needs.  Has and prefers crutches and 3-in-1. PT:  HEP                   DME Arranged:  N/A DME Agency:  NA  HH Arranged:  NA HH Agency:  NA  Additional Comments: Please contact me with any questions of if this plan should need to change.  Ennis FortsJill A Lauer, RN,CCM [facility]  [phone] 07/20/2018, 8:20 AM

## 2018-07-28 NOTE — Addendum Note (Signed)
Addendum  created 07/28/18 1103 by Elmer PickerWoodrum, Nekhi Liwanag L, MD   Clinical Note Signed

## 2019-02-22 IMAGING — RF DG HIP (WITH PELVIS) OPERATIVE*R*
1 series · 11 of 11 positions shown · non-contrast
Comparison: No prior.

CLINICAL DATA: Total right hip replacement.

EXAM:
OPERATIVE RIGHT HIP (WITH PELVIS IF PERFORMED) 7 VIEWS
TECHNIQUE: Fluoroscopic spot image(s) were submitted for interpretation
post-operatively.

[Series 1: unknown protocol · 0.20mm/px · 11 of 11 slices shown]
[im 1/11]
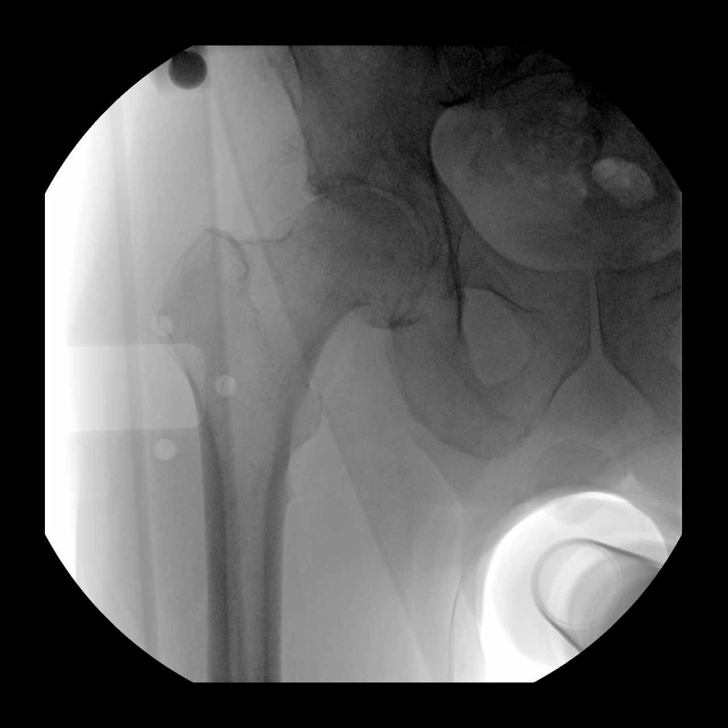
[im 2/11]
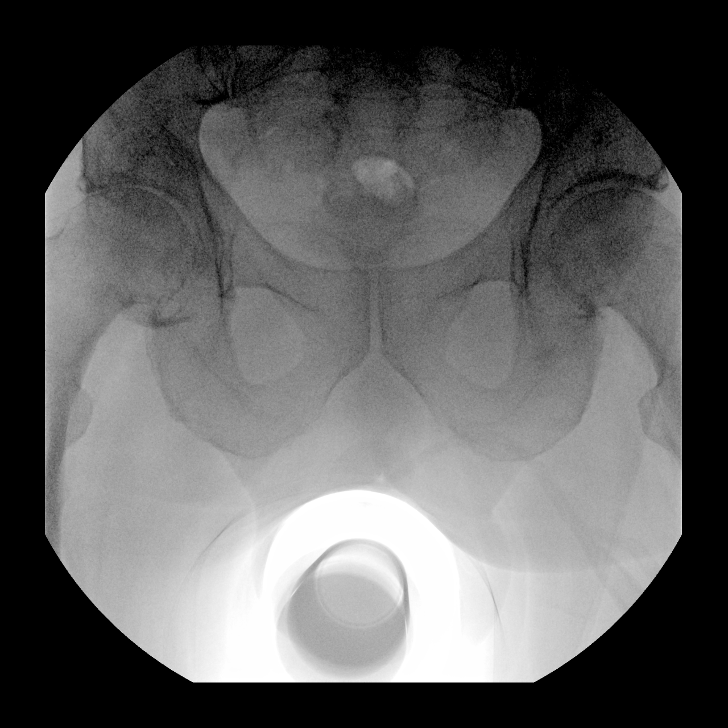
[im 3/11]
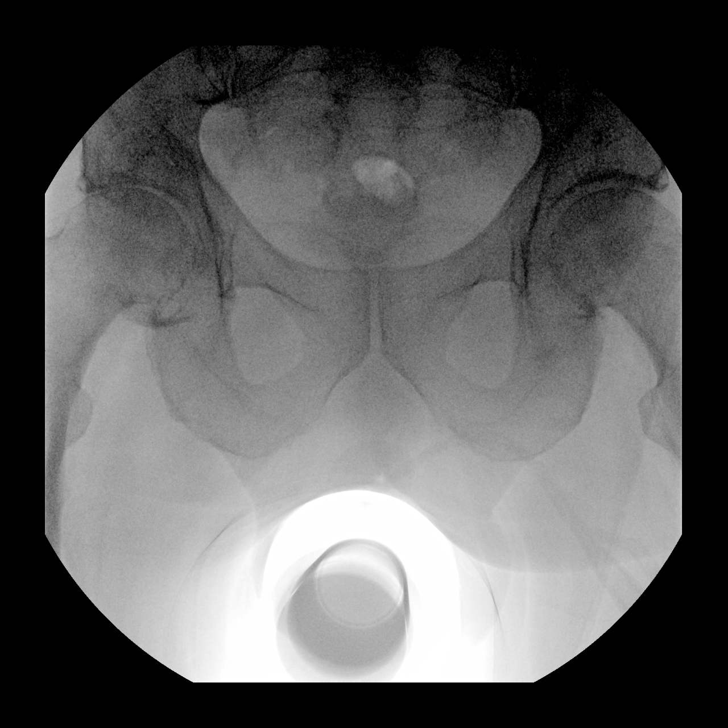
[im 4/11]
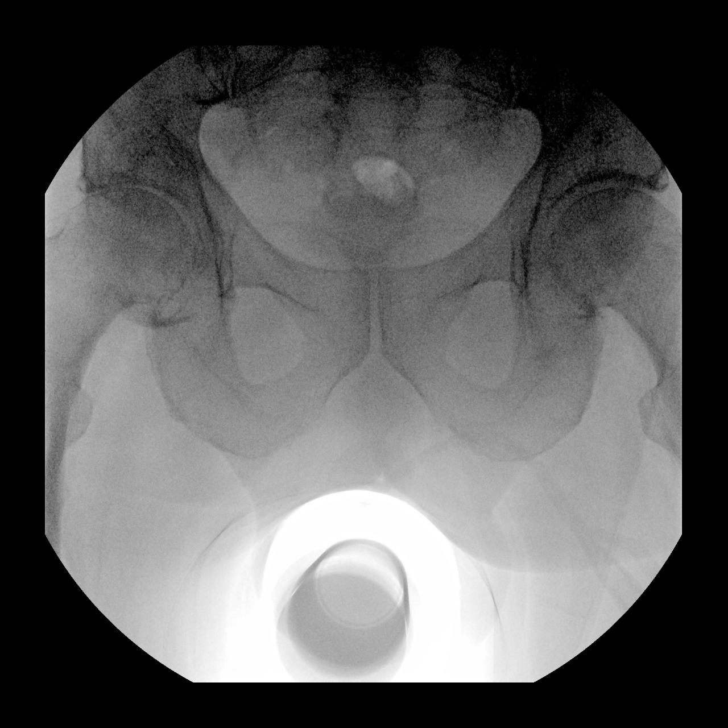
[im 5/11]
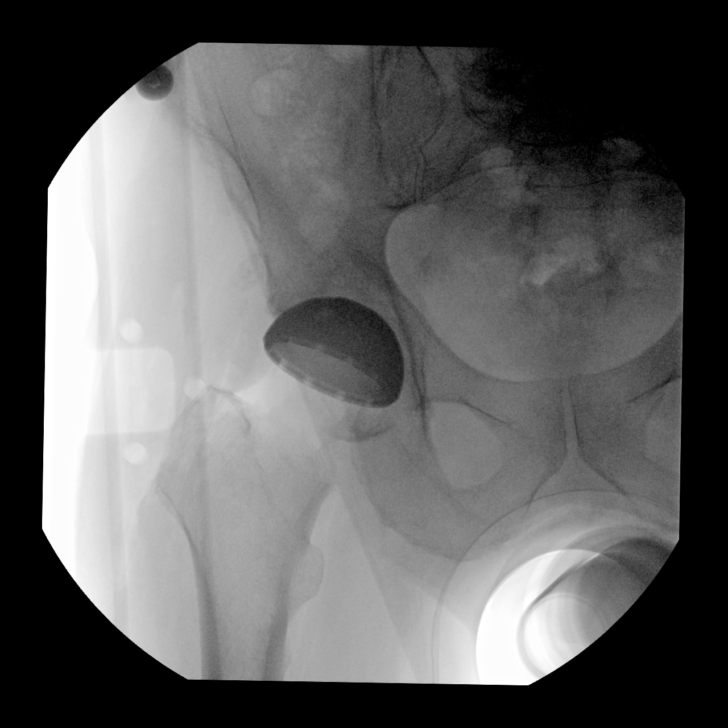
[im 6/11]
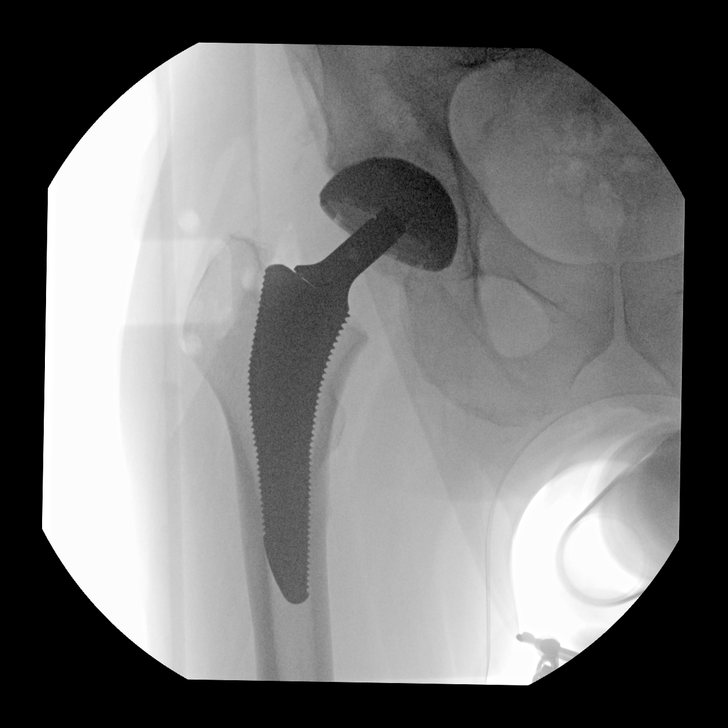
[im 7/11]
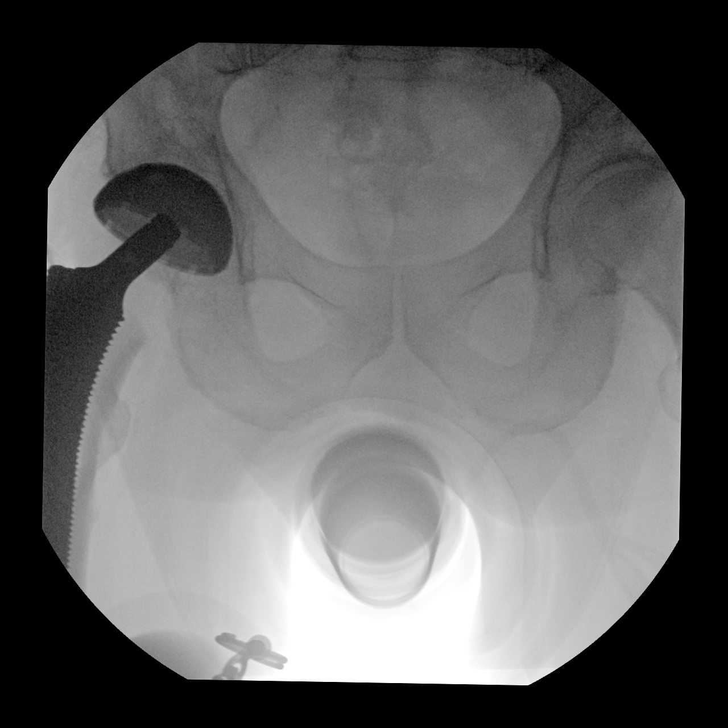
[im 8/11]
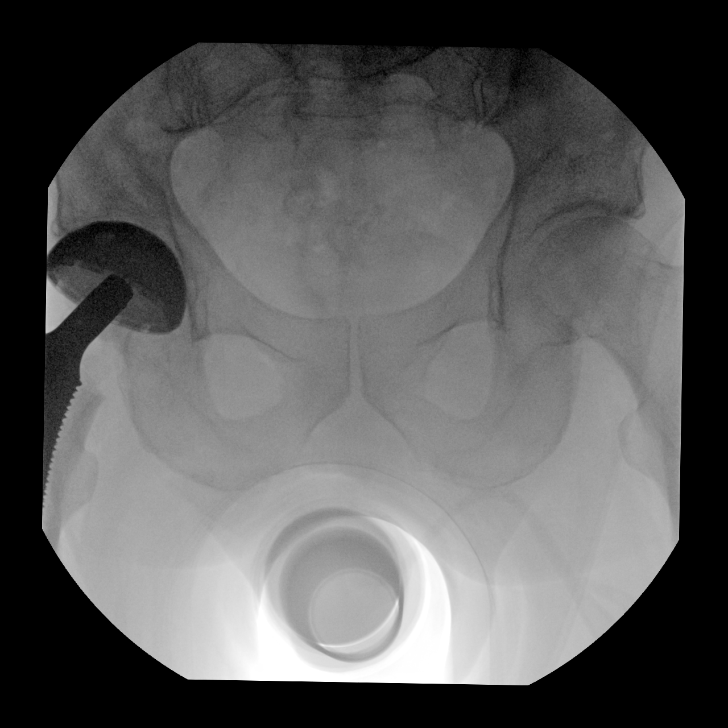
[im 9/11]
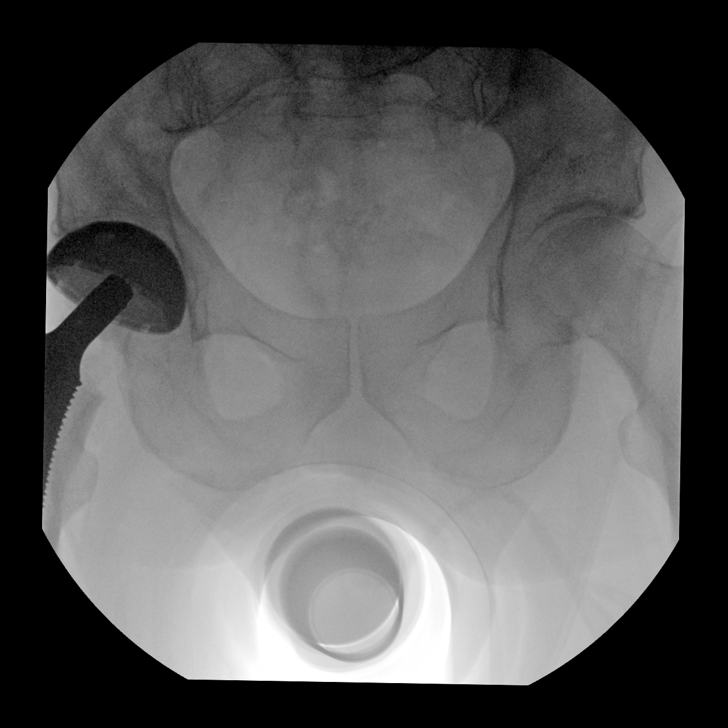
[im 10/11]
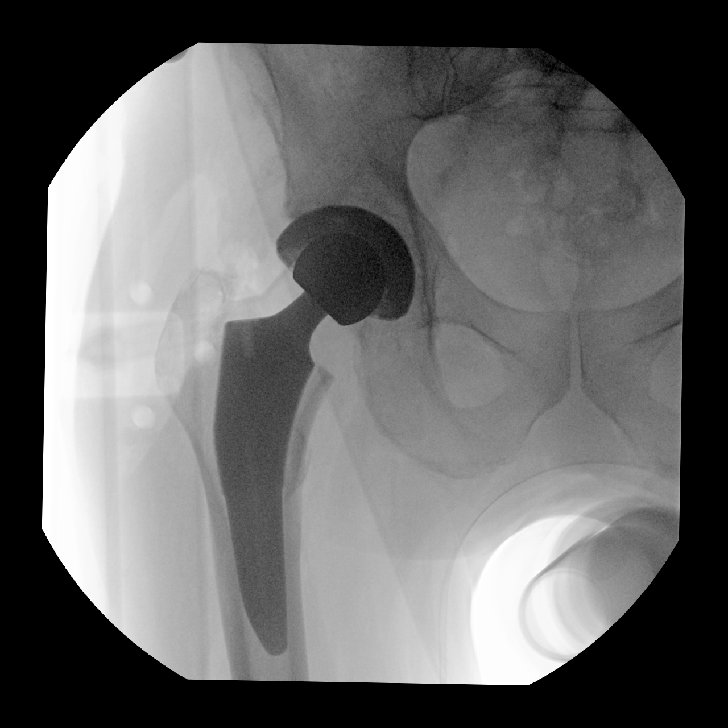
[im 11/11]
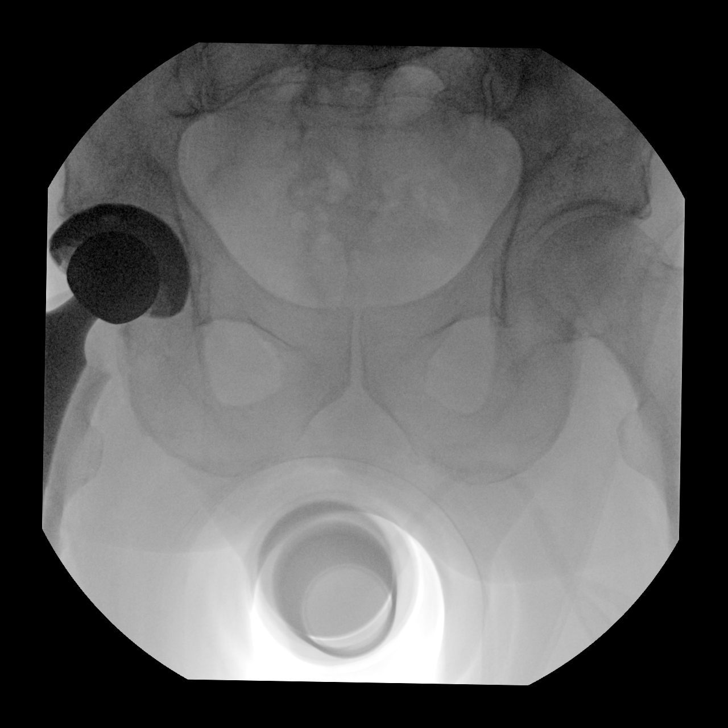

[11 of 11 positions shown; findings below may reference images not displayed]

FINDINGS: Total right hip replacement. Anatomic alignment. Hardware intact. No
acute abnormality. 0 minutes 10 seconds fluoroscopy time.
IMPRESSION: Total right hip replacement anatomic alignment.
